# Patient Record
Sex: Female | Born: 1995 | Race: Black or African American | Hispanic: No | Marital: Single | Smoking: Never smoker
Health system: Southern US, Community
[De-identification: ages and names within clinical notes are randomized; demographics above are authoritative.]

## PROBLEM LIST (undated history)

## (undated) ENCOUNTER — Inpatient Hospital Stay: Payer: Self-pay

## (undated) DIAGNOSIS — M419 Scoliosis, unspecified: Secondary | ICD-10-CM

## (undated) DIAGNOSIS — Z915 Personal history of self-harm: Secondary | ICD-10-CM

## (undated) DIAGNOSIS — T7840XA Allergy, unspecified, initial encounter: Secondary | ICD-10-CM

## (undated) DIAGNOSIS — Z87440 Personal history of urinary (tract) infections: Secondary | ICD-10-CM

## (undated) DIAGNOSIS — F419 Anxiety disorder, unspecified: Secondary | ICD-10-CM

## (undated) DIAGNOSIS — IMO0002 Reserved for concepts with insufficient information to code with codable children: Secondary | ICD-10-CM

## (undated) HISTORY — DX: Reserved for concepts with insufficient information to code with codable children: IMO0002

## (undated) HISTORY — PX: WISDOM TOOTH EXTRACTION: SHX21

## (undated) HISTORY — PX: OTHER SURGICAL HISTORY: SHX169

## (undated) HISTORY — DX: Scoliosis, unspecified: M41.9

## (undated) HISTORY — DX: Allergy, unspecified, initial encounter: T78.40XA

## (undated) HISTORY — DX: Personal history of urinary (tract) infections: Z87.440

## (undated) HISTORY — DX: Anxiety disorder, unspecified: F41.9

---
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
## 2015-01-14 ENCOUNTER — Emergency Department: Payer: Self-pay | Admitting: Emergency Medicine

## 2015-05-30 ENCOUNTER — Encounter: Payer: Self-pay | Admitting: *Deleted

## 2015-05-30 ENCOUNTER — Emergency Department
Admission: EM | Admit: 2015-05-30 | Discharge: 2015-05-31 | Disposition: A | Discharge status: Home / Self Care | Payer: Medicaid Other | Attending: Student | Admitting: Student

## 2015-05-30 DIAGNOSIS — Y9389 Activity, other specified: Secondary | ICD-10-CM | POA: Insufficient documentation

## 2015-05-30 DIAGNOSIS — S79921A Unspecified injury of right thigh, initial encounter: Secondary | ICD-10-CM | POA: Diagnosis present

## 2015-05-30 DIAGNOSIS — Y92410 Unspecified street and highway as the place of occurrence of the external cause: Secondary | ICD-10-CM | POA: Insufficient documentation

## 2015-05-30 DIAGNOSIS — S76911A Strain of unspecified muscles, fascia and tendons at thigh level, right thigh, initial encounter: Secondary | ICD-10-CM

## 2015-05-30 DIAGNOSIS — Y998 Other external cause status: Secondary | ICD-10-CM | POA: Insufficient documentation

## 2015-05-30 DIAGNOSIS — Z720 Tobacco use: Secondary | ICD-10-CM | POA: Insufficient documentation
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
## 2015-05-31 MED ORDER — KETOROLAC TROMETHAMINE 10 MG PO TABS: 10.0000 mg | ORAL_TABLET | Freq: Three times a day (TID) | ORAL | Status: DC

## 2015-05-31 MED ORDER — KETOROLAC TROMETHAMINE 10 MG PO TABS
ORAL_TABLET | ORAL | Status: AC
  Administered 2015-05-31: 20 mg via ORAL
  Filled 2015-05-31: qty 2

## 2015-05-31 MED ORDER — KETOROLAC TROMETHAMINE 10 MG PO TABS
20.0000 mg | ORAL_TABLET | Freq: Once | ORAL | Status: AC
  Administered 2015-05-31: 20 mg via ORAL
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
## 2015-12-10 ENCOUNTER — Encounter: Payer: Self-pay | Admitting: *Deleted

## 2015-12-10 ENCOUNTER — Emergency Department
Admission: EM | Admit: 2015-12-10 | Discharge: 2015-12-10 | Disposition: A | Discharge status: Home / Self Care | Payer: Medicaid Other | Attending: Emergency Medicine | Admitting: Emergency Medicine

## 2015-12-10 DIAGNOSIS — Z791 Long term (current) use of non-steroidal anti-inflammatories (NSAID): Secondary | ICD-10-CM | POA: Diagnosis not present

## 2015-12-10 DIAGNOSIS — F17200 Nicotine dependence, unspecified, uncomplicated: Secondary | ICD-10-CM | POA: Diagnosis not present

## 2015-12-10 DIAGNOSIS — M542 Cervicalgia: Secondary | ICD-10-CM | POA: Diagnosis present

## 2015-12-10 DIAGNOSIS — M62838 Other muscle spasm: Secondary | ICD-10-CM | POA: Insufficient documentation

## 2015-12-10 MED ORDER — MELOXICAM 15 MG PO TABS: 15.0000 mg | ORAL_TABLET | Freq: Every day | ORAL | Status: DC

## 2015-12-10 MED ORDER — METHOCARBAMOL 500 MG PO TABS: 500.0000 mg | ORAL_TABLET | Freq: Four times a day (QID) | ORAL | Status: DC

## 2015-12-10 NOTE — Discharge Instructions (Signed)
Muscle Cramps and Spasms °Muscle cramps and spasms occur when a muscle or muscles tighten and you have no control over this tightening (involuntary muscle contraction). They are a common problem and can develop in any muscle. The most common place is in the calf muscles of the leg. Both muscle cramps and muscle spasms are involuntary muscle contractions, but they also have differences:  °· Muscle cramps are sporadic and painful. They may last a few seconds to a quarter of an hour. Muscle cramps are often more forceful and last longer than muscle spasms. °· Muscle spasms may or may not be painful. They may also last just a few seconds or much longer. °CAUSES  °It is uncommon for cramps or spasms to be due to a serious underlying problem. In many cases, the cause of cramps or spasms is unknown. Some common causes are:  °· Overexertion.   °· Overuse from repetitive motions (doing the same thing over and over).   °· Remaining in a certain position for a long period of time.   °· Improper preparation, form, or technique while performing a sport or activity.   °· Dehydration.   °· Injury.   °· Side effects of some medicines.   °· Abnormally low levels of the salts and ions in your blood (electrolytes), especially potassium and calcium. This could happen if you are taking water pills (diuretics) or you are pregnant.   °Some underlying medical problems can make it more likely to develop cramps or spasms. These include, but are not limited to:  °· Diabetes.   °· Parkinson disease.   °· Hormone disorders, such as thyroid problems.   °· Alcohol abuse.   °· Diseases specific to muscles, joints, and bones.   °· Blood vessel disease where not enough blood is getting to the muscles.   °HOME CARE INSTRUCTIONS  °· Stay well hydrated. Drink enough water and fluids to keep your urine clear or pale yellow. °· It may be helpful to massage, stretch, and relax the affected muscle. °· For tight or tense muscles, use a warm towel, heating  pad, or hot shower water directed to the affected area. °· If you are sore or have pain after a cramp or spasm, applying ice to the affected area may relieve discomfort. °· Put ice in a plastic bag. °· Place a towel between your skin and the bag. °· Leave the ice on for 15-20 minutes, 03-04 times a day. °· Medicines used to treat a known cause of cramps or spasms may help reduce their frequency or severity. Only take over-the-counter or prescription medicines as directed by your caregiver. °SEEK MEDICAL CARE IF:  °Your cramps or spasms get more severe, more frequent, or do not improve over time.  °MAKE SURE YOU:  °· Understand these instructions. °· Will watch your condition. °· Will get help right away if you are not doing well or get worse. °  °This information is not intended to replace advice given to you by your health care provider. Make sure you discuss any questions you have with your health care provider. °  °Document Released: 05/02/2002 Document Revised: 03/07/2013 Document Reviewed: 10/27/2012 °Elsevier Interactive Patient Education ©2016 Elsevier Inc. ° °Heat Therapy °Heat therapy can help ease sore, stiff, injured, and tight muscles and joints. Heat relaxes your muscles, which may help ease your pain.  °RISKS AND COMPLICATIONS °If you have any of the following conditions, do not use heat therapy unless your health care provider has approved: °· Poor circulation. °· Healing wounds or scarred skin in the area being treated. °·   Diabetes, heart disease, or high blood pressure. °· Not being able to feel (numbness) the area being treated. °· Unusual swelling of the area being treated. °· Active infections. °· Blood clots. °· Cancer. °· Inability to communicate pain. This may include young children and people who have problems with their brain function (dementia). °· Pregnancy. °Heat therapy should only be used on old, pre-existing, or long-lasting (chronic) injuries. Do not use heat therapy on new injuries  unless directed by your health care provider. °HOW TO USE HEAT THERAPY °There are several different kinds of heat therapy, including: °· Moist heat pack. °· Warm water bath. °· Hot water bottle. °· Electric heating pad. °· Heated gel pack. °· Heated wrap. °· Electric heating pad. °Use the heat therapy method suggested by your health care provider. Follow your health care provider's instructions on when and how to use heat therapy. °GENERAL HEAT THERAPY RECOMMENDATIONS °· Do not sleep while using heat therapy. Only use heat therapy while you are awake. °· Your skin may turn pink while using heat therapy. Do not use heat therapy if your skin turns red. °· Do not use heat therapy if you have new pain. °· High heat or long exposure to heat can cause burns. Be careful when using heat therapy to avoid burning your skin. °· Do not use heat therapy on areas of your skin that are already irritated, such as with a rash or sunburn. °SEEK MEDICAL CARE IF: °· You have blisters, redness, swelling, or numbness. °· You have new pain. °· Your pain is worse. °MAKE SURE YOU: °· Understand these instructions. °· Will watch your condition. °· Will get help right away if you are not doing well or get worse. °  °This information is not intended to replace advice given to you by your health care provider. Make sure you discuss any questions you have with your health care provider. °  °Document Released: 02/02/2012 Document Revised: 12/01/2014 Document Reviewed: 01/03/2014 °Elsevier Interactive Patient Education ©2016 Elsevier Inc. ° °

## 2015-12-10 NOTE — ED Notes (Signed)
Pt complains of waking up with a stiff neck, pt denies any other symptoms

## 2015-12-10 NOTE — ED Provider Notes (Signed)
[facility] Emergency Department Provider Note  ____________________________________________  Time seen: Approximately 6:01 PM  I have reviewed the triage vital signs and the nursing notes.   HISTORY  Chief Complaint Torticollis    HPI Morgan Young is a 20 y.o. female who presents to emergency department complaining of right-sided neck pain. Patient states that symptoms began this afternoon. No history of injury or overuse. Patient denies any radicular symptoms down her right upper extremity. Patient is not taking any medications prior to arrival. Patient denies any headache, visual acuity changes, chest pain, shortness of breath, abdominal pain, nausea or vomiting. She denies any fevers or chills.   History reviewed. No pertinent past medical history.  There are no active problems to display for this patient.   History reviewed. No pertinent past surgical history.  Current Outpatient Rx  Name  Route  Sig  Dispense  Refill  . ketorolac (TORADOL) 10 MG tablet   Oral   Take 1 tablet (10 mg total) by mouth every 8 (eight) hours.   15 tablet   0   . meloxicam (MOBIC) 15 MG tablet   Oral   Take 1 tablet (15 mg total) by mouth daily.   30 tablet   0   . methocarbamol (ROBAXIN) 500 MG tablet   Oral   Take 1 tablet (500 mg total) by mouth 4 (four) times daily.   16 tablet   0     Allergies Review of patient's allergies indicates no known allergies.  No family history on file.  Social History Social History  Substance Use Topics  . Smoking status: Current Some Day Smoker  . Smokeless tobacco: None  . Alcohol Use: No     Review of Systems  Constitutional: No fever/chills Eyes: No visual changes. No discharge ENT: No sore throat. Cardiovascular: no chest pain. Respiratory: no cough. No SOB. Gastrointestinal: No abdominal pain.  No nausea, no vomiting.  No diarrhea.  No constipation. Genitourinary: Negative for dysuria. No  hematuria Musculoskeletal: Negative for back pain. Positive for right-sided neck pain. Skin: Negative for rash. Neurological: Negative for headaches, focal weakness or numbness. 10-point ROS otherwise negative.  ____________________________________________   PHYSICAL EXAM:  VITAL SIGNS: ED Triage Vitals  Enc Vitals Group     BP 12/10/15 1709 133/66 mmHg     Pulse Rate 12/10/15 1709 80     Resp 12/10/15 1709 20     Temp 12/10/15 1709 98.5 F (36.9 C)     Temp Source 12/10/15 1709 Oral     SpO2 12/10/15 1709 96 %     Weight 12/10/15 1709 130 lb (58.968 kg)     Height 12/10/15 1709 5\' 3"  (1.6 m)     Head Cir --      Peak Flow --      Pain Score --      Pain Loc --      Pain Edu? --      Excl. in GC? --      Constitutional: Alert and oriented. Well appearing and in no acute distress. Eyes: Conjunctivae are normal. PERRL. EOMI. Head: Atraumatic. ENT:      Ears:       Nose: No congestion/rhinnorhea.      Mouth/Throat: Mucous membranes are moist.  Neck: No stridor. No visible abnormality to neck to Inspection. No cervical spine tenderness to palpation. She is tenderness to palpation over the paraspinal muscle group right side. Palpable spasms are noted to paraspinal muscle group. Hematological/Lymphatic/Immunilogical:  No cervical lymphadenopathy. Cardiovascular: Normal rate, regular rhythm. Normal S1 and S2.  Good peripheral circulation. Respiratory: Normal respiratory effort without tachypnea or retractions. Lungs CTAB. Gastrointestinal: Soft and nontender. No distention. No CVA tenderness. Musculoskeletal: No lower extremity tenderness nor edema.  No joint effusions. Neurologic:  Normal speech and language. No gross focal neurologic deficits are appreciated.  Skin:  Skin is warm, dry and intact. No rash noted. Psychiatric: Mood and affect are normal. Speech and behavior are normal. Patient exhibits appropriate insight and  judgement.   ____________________________________________   LABS (all labs ordered are listed, but only abnormal results are displayed)  Labs Reviewed - No data to display ____________________________________________  EKG   ____________________________________________  RADIOLOGY   No results found.  ____________________________________________    PROCEDURES  Procedure(s) performed:       Medications - No data to display   ____________________________________________   INITIAL IMPRESSION / ASSESSMENT AND PLAN / ED COURSE  Pertinent labs & imaging results that were available during my care of the patient were reviewed by me and considered in my medical decision making (see chart for details).  Patient's diagnosis is consistent with paraspinal muscle spasms in the right cervical region. There is no known injury prior to symptoms. Patient will not be x-rayed at this time. Patient will be discharged home with prescriptions for muscle relaxers and anti-inflammatories. Patient is to follow up with [facility] care provider if symptoms persist past this treatment course. Patient is given ED precautions to return to the ED for any worsening or new symptoms.     ____________________________________________  FINAL CLINICAL IMPRESSION(S) / ED DIAGNOSES  Final diagnoses:  Cervical paraspinal muscle spasm      NEW MEDICATIONS STARTED DURING THIS VISIT:  Discharge Medication List as of 12/10/2015  6:15 PM    START taking these medications   Details  meloxicam (MOBIC) 15 MG tablet Take 1 tablet (15 mg total) by mouth daily., Starting 12/10/2015, Until Discontinued, Print    methocarbamol (ROBAXIN) 500 MG tablet Take 1 tablet (500 mg total) by mouth 4 (four) times daily., Starting 12/10/2015, Until Discontinued, Print            Racheal Patches, PA-C 12/10/15 1851  Phineas Semen, MD 12/10/15 1911

## 2016-02-08 ENCOUNTER — Emergency Department
Admission: EM | Admit: 2016-02-08 | Discharge: 2016-02-08 | Disposition: A | Discharge status: Home / Self Care | Payer: Medicaid Other | Attending: Emergency Medicine | Admitting: Emergency Medicine

## 2016-02-08 ENCOUNTER — Encounter: Payer: Self-pay | Admitting: Emergency Medicine

## 2016-02-08 DIAGNOSIS — R109 Unspecified abdominal pain: Secondary | ICD-10-CM | POA: Diagnosis present

## 2016-02-08 DIAGNOSIS — R112 Nausea with vomiting, unspecified: Secondary | ICD-10-CM

## 2016-02-08 DIAGNOSIS — Z3202 Encounter for pregnancy test, result negative: Secondary | ICD-10-CM | POA: Diagnosis not present

## 2016-02-08 DIAGNOSIS — F17210 Nicotine dependence, cigarettes, uncomplicated: Secondary | ICD-10-CM | POA: Insufficient documentation

## 2016-02-08 DIAGNOSIS — N390 Urinary tract infection, site not specified: Secondary | ICD-10-CM | POA: Diagnosis not present

## 2016-02-08 LAB — COMPREHENSIVE METABOLIC PANEL
ALT: 17 U/L (ref 14–54)
ANION GAP: 7 (ref 5–15)
AST: 23 U/L (ref 15–41)
Albumin: 4.5 g/dL (ref 3.5–5.0)
Alkaline Phosphatase: 56 U/L (ref 38–126)
BUN: 14 mg/dL (ref 6–20)
CALCIUM: 9.5 mg/dL (ref 8.9–10.3)
CO2: 23 mmol/L (ref 22–32)
Chloride: 105 mmol/L (ref 101–111)
Creatinine, Ser: 0.75 mg/dL (ref 0.44–1.00)
GFR calc non Af Amer: >60 mL/min (ref >60)
Glucose, Bld: 98 mg/dL (ref 65–99)
POTASSIUM: 3.4 mmol/L — AB (ref 3.5–5.1)
SODIUM: 135 mmol/L (ref 135–145)
TOTAL PROTEIN: 8.1 g/dL (ref 6.5–8.1)
Total Bilirubin: 0.7 mg/dL (ref 0.3–1.2)

## 2016-02-08 LAB — URINALYSIS COMPLETE WITH MICROSCOPIC (ARMC ONLY)
Bilirubin Urine: NEGATIVE
Glucose, UA: NEGATIVE mg/dL
Nitrite: POSITIVE — AB
PH: 6 (ref 5.0–8.0)
PROTEIN: NEGATIVE mg/dL
SPECIFIC GRAVITY, URINE: 1.026 (ref 1.005–1.030)

## 2016-02-08 LAB — CBC
HEMATOCRIT: 41.2 % (ref 35.0–47.0)
HEMOGLOBIN: 14.1 g/dL (ref 12.0–16.0)
MCH: 30 pg (ref 26.0–34.0)
MCHC: 34.1 g/dL (ref 32.0–36.0)
MCV: 87.9 fL (ref 80.0–100.0)
Platelets: 222 10*3/uL (ref 150–440)
RBC: 4.69 MIL/uL (ref 3.80–5.20)
RDW: 13 % (ref 11.5–14.5)
WBC: 5 10*3/uL (ref 3.6–11.0)

## 2016-02-08 LAB — POCT PREGNANCY, URINE: PREG TEST UR: NEGATIVE

## 2016-02-08 LAB — LIPASE, BLOOD: Lipase: 17 U/L (ref 11–51)

## 2016-02-08 MED ORDER — ONDANSETRON 4 MG PO TBDP
ORAL_TABLET | ORAL | Status: AC
  Administered 2016-02-08: 4 mg via ORAL
  Filled 2016-02-08: qty 1

## 2016-02-08 MED ORDER — ONDANSETRON 4 MG PO TBDP
4.0000 mg | ORAL_TABLET | Freq: Once | ORAL | Status: AC
  Administered 2016-02-08: 4 mg via ORAL

## 2016-02-08 MED ORDER — MORPHINE SULFATE (PF) 2 MG/ML IV SOLN
INTRAVENOUS | Status: AC
  Administered 2016-02-08: 2 mg via INTRAVENOUS
  Filled 2016-02-08: qty 1

## 2016-02-08 MED ORDER — SODIUM CHLORIDE 0.9 % IV BOLUS (SEPSIS)
1000.0000 mL | Freq: Once | INTRAVENOUS | Status: AC
  Administered 2016-02-08: 1000 mL via INTRAVENOUS

## 2016-02-08 MED ORDER — ONDANSETRON HCL 4 MG/2ML IJ SOLN
4.0000 mg | Freq: Once | INTRAMUSCULAR | Status: AC
  Administered 2016-02-08: 4 mg via INTRAVENOUS

## 2016-02-08 MED ORDER — ONDANSETRON HCL 4 MG/2ML IJ SOLN
INTRAMUSCULAR | Status: AC
  Administered 2016-02-08: 4 mg via INTRAVENOUS
  Filled 2016-02-08: qty 2

## 2016-02-08 MED ORDER — SULFAMETHOXAZOLE-TRIMETHOPRIM 800-160 MG PO TABS: 1.0000 | ORAL_TABLET | Freq: Two times a day (BID) | ORAL | Status: DC

## 2016-02-08 MED ORDER — ONDANSETRON 4 MG PO TBDP: 4.0000 mg | ORAL_TABLET | Freq: Three times a day (TID) | ORAL | Status: DC | PRN

## 2016-02-08 MED ORDER — MORPHINE SULFATE (PF) 2 MG/ML IV SOLN
2.0000 mg | Freq: Once | INTRAVENOUS | Status: AC
  Administered 2016-02-08: 2 mg via INTRAVENOUS

## 2016-02-08 NOTE — ED Notes (Signed)
Pt vomited into emesis bag. Emesis bag changed out. MD Malinda notified.

## 2016-02-08 NOTE — ED Notes (Signed)
Patient discharged.  Complaining of lightheadedness when she stood up.  Patient sat back down on end of stretcher.  IV removed left forearm without redness or edema.  Catheter intact.  Patient given ice water to drink.  States she feels better.  Advised not to drive or operate heavy machinery today.

## 2016-02-08 NOTE — Discharge Instructions (Signed)
Abdominal Pain, Adult Many things can cause abdominal pain. Usually, abdominal pain is not caused by a disease and will improve without treatment. It can often be observed and treated at home. Your health care provider will do a physical exam and possibly order blood tests and X-rays to help determine the seriousness of your pain. However, in many cases, more time must pass before a clear cause of the pain can be found. Before that point, your health care provider may not know if you need more testing or further treatment. HOME CARE INSTRUCTIONS Monitor your abdominal pain for any changes. The following actions may help to alleviate any discomfort you are experiencing:  Only take over-the-counter or prescription medicines as directed by your health care provider.  Do not take laxatives unless directed to do so by your health care provider.  Try a clear liquid diet (broth, tea, or water) as directed by your health care provider. Slowly move to a bland diet as tolerated. SEEK MEDICAL CARE IF:  You have unexplained abdominal pain.  You have abdominal pain associated with nausea or diarrhea.  You have pain when you urinate or have a bowel movement.  You experience abdominal pain that wakes you in the night.  You have abdominal pain that is worsened or improved by eating food.  You have abdominal pain that is worsened with eating fatty foods.  You have a fever. SEEK IMMEDIATE MEDICAL CARE IF:  Your pain does not go away within 2 hours.  You keep throwing up (vomiting).  Your pain is felt only in portions of the abdomen, such as the right side or the left lower portion of the abdomen.  You pass bloody or black tarry stools. MAKE SURE YOU:  Understand these instructions.  Will watch your condition.  Will get help right away if you are not doing well or get worse.   This information is not intended to replace advice given to you by your health care provider. Make sure you discuss  any questions you have with your health care provider.   Document Released: 08/20/2005 Document Revised: 08/01/2015 Document Reviewed: 07/20/2013 Elsevier Interactive Patient Education 2016 [employer] Be careful taking the Bactrim sometimes it may interfere with the Implanon. He might want to use a secondary method of birth control like. Condoms while you're taking it for a short time afterwards.

## 2016-02-08 NOTE — ED Notes (Signed)
Discharged via wheelchair to car

## 2016-02-08 NOTE — ED Notes (Signed)
Patient ambulatory to triage with steady gait, without difficulty or distress noted; pt reports nausea x 2 wks; V x 1 with mid abd pain

## 2016-02-08 NOTE — ED Provider Notes (Signed)
[facility] Emergency Department Provider Note  ____________________________________________  Time seen: Approximately 5:41 AM  I have reviewed the triage vital signs and the nursing notes.   HISTORY  Chief Complaint Abdominal Pain    HPI Morgan Young is a 20 y.o. female patient reports she had Implanon placed in January. This replaced on old Implanon she had for 3 years. She shows me the car but the cardiac she said she had it done in March 2016. She is fairly certain she had done this January at any rate patient has complained of nausea for about 2 weeks and then today began having vomiting she is also having cramping as if she is got have diarrhea. Patient reports that she's had the Implanon placed she's been having back aches and hot flashes and other symptoms which she did not have previously. She is wondering if she could've gotten pregnant on Implanon.   History reviewed. No pertinent past medical history.  There are no active problems to display for this patient.   History reviewed. No pertinent past surgical history.  Current Outpatient Rx  Name  Route  Sig  Dispense  Refill  . ketorolac (TORADOL) 10 MG tablet   Oral   Take 1 tablet (10 mg total) by mouth every 8 (eight) hours. Patient not taking: Reported on 02/08/2016   15 tablet   0   . meloxicam (MOBIC) 15 MG tablet   Oral   Take 1 tablet (15 mg total) by mouth daily. Patient not taking: Reported on 02/08/2016   30 tablet   0   . methocarbamol (ROBAXIN) 500 MG tablet   Oral   Take 1 tablet (500 mg total) by mouth 4 (four) times daily. Patient not taking: Reported on 02/08/2016   16 tablet   0     Allergies Review of patient's allergies indicates no known allergies.  No family history on file.  Social History Social History  Substance Use Topics  . Smoking status: Current Some Day Smoker  . Smokeless tobacco: None  . Alcohol Use: No    Review of Systems Constitutional:  No fever/chills Eyes: No visual changes. ENT: No sore throat. Cardiovascular: Denies chest pain. Respiratory: Denies shortness of breath. Gastrointestinal: See history of present illness Genitourinary: Negative for dysuria. Musculoskeletal: Negative for back pain. Skin: Negative for rash. Neurological: Negative for headaches, focal weakness or numbness.  10-point ROS otherwise negative.  ____________________________________________   PHYSICAL EXAM:  VITAL SIGNS: ED Triage Vitals  Enc Vitals Group     BP 02/08/16 0252 119/61 mmHg     Pulse Rate 02/08/16 0252 94     Resp 02/08/16 0252 20     Temp 02/08/16 0252 98 F (36.7 C)     Temp Source 02/08/16 0252 Oral     SpO2 02/08/16 0252 99 %     Weight 02/08/16 0252 165 lb (74.844 kg)     Height 02/08/16 0252  (1.702 m)     Head Cir --      Peak Flow --      Pain Score 02/08/16 0252 9     Pain Loc --      Pain Edu? --      Excl. in GC? --     Constitutional: Alert and oriented. Well appearing and in no acute distress. Eyes: Conjunctivae are normal. PERRL. EOMI. Head: Atraumatic. Nose: No congestion/rhinnorhea. Mouth/Throat: Mucous membranes are moist.  Oropharynx non-erythematous. Neck: No stridor.   Cardiovascular: Normal rate, regular  rhythm. Grossly normal heart sounds.  Good peripheral circulation. Respiratory: Normal respiratory effort.  No retractions. Lungs CTAB. Gastrointestinal: Soft Hyperactive bowel sounds there is tenderness to palpation throughout especially in the middle of the abdomen.. No distention. No abdominal bruits. No CVA tenderness. Musculoskeletal: No lower extremity tenderness nor edema.  No joint effusions. Neurologic:  Normal speech and language. No gross focal neurologic deficits are appreciated. No gait instability. Skin:  Skin is warm, dry and intact. No rash noted. Psychiatric: Mood and affect are normal. Speech and behavior are normal.  ____________________________________________    LABS (all labs ordered are listed, but only abnormal results are displayed)  Labs Reviewed  COMPREHENSIVE METABOLIC PANEL - Abnormal; Notable for the following:    Potassium 3.4 (*)    All other components within normal limits  LIPASE, BLOOD  CBC  URINALYSIS COMPLETEWITH MICROSCOPIC ([facility] ONLY)  POC URINE PREG, ED   ____________________________________________  EKG   ____________________________________________  RADIOLOGY   ____________________________________________   PROCEDURES    ____________________________________________   INITIAL IMPRESSION / ASSESSMENT AND PLAN / ED COURSE  Pertinent labs & imaging results that were available during my care of the patient were reviewed by me and considered in my medical decision making (see chart for details).  Patient's urine is pending patient has not urinated yet will sign the patient out to Dr. Dione BoozeKanner. ____________________________________________   FINAL CLINICAL IMPRESSION(S) / ED DIAGNOSES  Final diagnoses:  Nausea and vomiting, vomiting of unspecified type  Abdominal pain, unspecified abdominal location      Arnaldo NatalPaul F Shamikia Linskey, MD 02/08/16 [phone]

## 2016-02-08 NOTE — ED Notes (Signed)
Pt states she is still unable to void at this time

## 2016-06-05 ENCOUNTER — Encounter: Payer: Self-pay | Admitting: *Deleted

## 2016-06-05 ENCOUNTER — Emergency Department
Admission: EM | Admit: 2016-06-05 | Discharge: 2016-06-05 | Disposition: A | Discharge status: Home / Self Care | Payer: Medicaid Other | Attending: Emergency Medicine | Admitting: Emergency Medicine

## 2016-06-05 DIAGNOSIS — R197 Diarrhea, unspecified: Secondary | ICD-10-CM | POA: Diagnosis not present

## 2016-06-05 DIAGNOSIS — R51 Headache: Secondary | ICD-10-CM | POA: Insufficient documentation

## 2016-06-05 DIAGNOSIS — F17200 Nicotine dependence, unspecified, uncomplicated: Secondary | ICD-10-CM | POA: Diagnosis not present

## 2016-06-05 DIAGNOSIS — Z79899 Other long term (current) drug therapy: Secondary | ICD-10-CM | POA: Diagnosis not present

## 2016-06-05 DIAGNOSIS — R112 Nausea with vomiting, unspecified: Secondary | ICD-10-CM | POA: Diagnosis not present

## 2016-06-05 DIAGNOSIS — R519 Headache, unspecified: Secondary | ICD-10-CM

## 2016-06-05 DIAGNOSIS — R109 Unspecified abdominal pain: Secondary | ICD-10-CM

## 2016-06-05 LAB — COMPREHENSIVE METABOLIC PANEL
ALK PHOS: 48 U/L (ref 38–126)
ALT: 12 U/L — AB (ref 14–54)
AST: 18 U/L (ref 15–41)
Albumin: 4.1 g/dL (ref 3.5–5.0)
Anion gap: 6 (ref 5–15)
BILIRUBIN TOTAL: 1.5 mg/dL — AB (ref 0.3–1.2)
BUN: 13 mg/dL (ref 6–20)
CALCIUM: 9 mg/dL (ref 8.9–10.3)
CHLORIDE: 107 mmol/L (ref 101–111)
CO2: 23 mmol/L (ref 22–32)
CREATININE: 0.7 mg/dL (ref 0.44–1.00)
Glucose, Bld: 95 mg/dL (ref 65–99)
Potassium: 3.6 mmol/L (ref 3.5–5.1)
Sodium: 136 mmol/L (ref 135–145)
TOTAL PROTEIN: 7.3 g/dL (ref 6.5–8.1)

## 2016-06-05 LAB — URINALYSIS COMPLETE WITH MICROSCOPIC (ARMC ONLY)
BILIRUBIN URINE: NEGATIVE
Bacteria, UA: NONE SEEN
GLUCOSE, UA: NEGATIVE mg/dL
KETONES UR: NEGATIVE mg/dL
LEUKOCYTES UA: NEGATIVE
Nitrite: NEGATIVE
Protein, ur: NEGATIVE mg/dL
Specific Gravity, Urine: 1.011 (ref 1.005–1.030)
pH: 6 (ref 5.0–8.0)

## 2016-06-05 LAB — CBC
HCT: 37.6 % (ref 35.0–47.0)
Hemoglobin: 13.3 g/dL (ref 12.0–16.0)
MCH: 31.1 pg (ref 26.0–34.0)
MCHC: 35.5 g/dL (ref 32.0–36.0)
MCV: 87.8 fL (ref 80.0–100.0)
PLATELETS: 211 10*3/uL (ref 150–440)
RBC: 4.28 MIL/uL (ref 3.80–5.20)
RDW: 13.3 % (ref 11.5–14.5)
WBC: 4.1 10*3/uL (ref 3.6–11.0)

## 2016-06-05 LAB — POCT PREGNANCY, URINE: Preg Test, Ur: NEGATIVE

## 2016-06-05 LAB — LIPASE, BLOOD: Lipase: 20 U/L (ref 11–51)

## 2016-06-05 MED ORDER — KETOROLAC TROMETHAMINE 30 MG/ML IJ SOLN
30.0000 mg | Freq: Once | INTRAMUSCULAR | Status: AC
Start: 2016-06-05 — End: 2016-06-05
  Administered 2016-06-05: 30 mg via INTRAVENOUS
  Filled 2016-06-05: qty 1

## 2016-06-05 MED ORDER — ONDANSETRON HCL 4 MG/2ML IJ SOLN
4.0000 mg | Freq: Once | INTRAMUSCULAR | Status: AC
Start: 2016-06-05 — End: 2016-06-05
  Administered 2016-06-05: 4 mg via INTRAVENOUS
  Filled 2016-06-05: qty 2

## 2016-06-05 MED ORDER — ACETAMINOPHEN 500 MG PO TABS
1000.0000 mg | ORAL_TABLET | Freq: Once | ORAL | Status: AC
  Administered 2016-06-05: 1000 mg via ORAL
  Filled 2016-06-05: qty 2

## 2016-06-05 MED ORDER — METOCLOPRAMIDE HCL 10 MG PO TABS: 10.0000 mg | ORAL_TABLET | Freq: Three times a day (TID) | ORAL | Status: DC | PRN

## 2016-06-05 MED ORDER — PROCHLORPERAZINE EDISYLATE 5 MG/ML IJ SOLN
10.0000 mg | Freq: Once | INTRAMUSCULAR | Status: DC
  Filled 2016-06-05: qty 2

## 2016-06-05 MED ORDER — SODIUM CHLORIDE 0.9 % IV BOLUS (SEPSIS)
1000.0000 mL | Freq: Once | INTRAVENOUS | Status: AC
  Administered 2016-06-05: 1000 mL via INTRAVENOUS

## 2016-06-05 NOTE — ED Provider Notes (Signed)
[facility] Emergency Department Provider Note    ____________________________________________  Time seen: ~0625  I have reviewed the triage vital signs and the nursing notes.   HISTORY  Chief Complaint Headache and Abdominal Pain   History limited by: Not Limited   HPI AVERIE Young is a 20 y.o. female who presents to the emergency department today with complaints of headache and abdominal pain. The patient says that the symptoms started yesterday after getting off work. They have been persistent since then. She describes the headache as being throbbing in nature. It is located throughout her head. It is severe. She has taken over-the-counter medications which will temporarily helped the headache. She says she does have a history of headaches however this one is more severe than most. In addition the patient is complaining of abdominal pain. She does have a history of frequent abdominal pains and has seen primary care for this in the past. She states that they've given her medication although have not been able to tell her what the abdominal pain is due to. She has had associated nausea and vomiting. Additionally she has had diarrhea. She denies any blood in the vomit or diarrhea. She denies any fevers.   History reviewed. No pertinent past medical history.  There are no active problems to display for this patient.   History reviewed. No pertinent past surgical history.  Current Outpatient Rx  Name  Route  Sig  Dispense  Refill  . ketorolac (TORADOL) 10 MG tablet   Oral   Take 1 tablet (10 mg total) by mouth every 8 (eight) hours. Patient not taking: Reported on 02/08/2016   15 tablet   0   . meloxicam (MOBIC) 15 MG tablet   Oral   Take 1 tablet (15 mg total) by mouth daily. Patient not taking: Reported on 02/08/2016   30 tablet   0   . methocarbamol (ROBAXIN) 500 MG tablet   Oral   Take 1 tablet (500 mg total) by mouth 4 (four) times  daily. Patient not taking: Reported on 02/08/2016   16 tablet   0   . ondansetron (ZOFRAN ODT) 4 MG disintegrating tablet   Oral   Take 1 tablet (4 mg total) by mouth every 8 (eight) hours as needed for nausea or vomiting.   20 tablet   0   . sulfamethoxazole-trimethoprim (BACTRIM DS,SEPTRA DS) 800-160 MG tablet   Oral   Take 1 tablet by mouth 2 (two) times daily.   20 tablet   0     Allergies Review of patient's allergies indicates no known allergies.  History reviewed. No pertinent family history.  Social History Social History  Substance Use Topics  . Smoking status: Current Some Day Smoker  . Smokeless tobacco: None  . Alcohol Use: No    Review of Systems  Constitutional: Negative for fever. Cardiovascular: Negative for chest pain. Respiratory: Negative for shortness of breath. Gastrointestinal: Positive for abdominal pain, vomiting and diarrhea. Genitourinary: Negative for dysuria. Musculoskeletal: Negative for back pain. Skin: Negative for rash. Neurological: Positive for headache  10-point ROS otherwise negative.  ____________________________________________   PHYSICAL EXAM:  VITAL SIGNS: ED Triage Vitals  Enc Vitals Group     BP 06/05/16 0612 110/65 mmHg     Pulse Rate 06/05/16 0612 75     Resp 06/05/16 0612 18     Temp 06/05/16 0612 98.4 F (36.9 C)     Temp Source 06/05/16 0612 Oral     SpO2  06/05/16 0612 99 %     Weight 06/05/16 0612 150 lb (68.04 kg)     Height 06/05/16 0612 5\' 7"  (1.702 m)     Head Cir --      Peak Flow --      Pain Score 06/05/16 0610 10   Constitutional: Alert and oriented. Well appearing and in no distress. Eyes: Conjunctivae are normal. PERRL. Normal extraocular movements. ENT   Head: Normocephalic and atraumatic.   Nose: No congestion/rhinnorhea.   Mouth/Throat: Mucous membranes are moist.   Neck: No stridor. Hematological/Lymphatic/Immunilogical: No cervical lymphadenopathy. Cardiovascular:  Normal rate, regular rhythm.  No murmurs, rubs, or gallops. Respiratory: Normal respiratory effort without tachypnea nor retractions. Breath sounds are clear and equal bilaterally. No wheezes/rales/rhonchi. Gastrointestinal: Soft and nontender. No distention. There is no CVA tenderness. Genitourinary: Deferred Musculoskeletal: Normal range of motion in all extremities. No joint effusions.  No lower extremity tenderness nor edema. Neurologic:  Normal speech and language. No gross focal neurologic deficits are appreciated.  Skin:  Skin is warm, dry and intact. No rash noted. Psychiatric: Mood and affect are normal. Speech and behavior are normal. Patient exhibits appropriate insight and judgment.  ____________________________________________    LABS (pertinent positives/negatives)  Labs Reviewed  COMPREHENSIVE METABOLIC PANEL - Abnormal; Notable for the following:    ALT 12 (*)    Total Bilirubin 1.5 (*)    All other components within normal limits  LIPASE, BLOOD  CBC  URINALYSIS COMPLETEWITH MICROSCOPIC ([facility] ONLY)  POC URINE PREG, ED   Urine studies pending at this time  ____________________________________________   EKG  None  ____________________________________________    RADIOLOGY  None  ____________________________________________   PROCEDURES  Procedure(s) performed: None  Critical Care performed: No  ____________________________________________   INITIAL IMPRESSION / ASSESSMENT AND PLAN / ED COURSE  Pertinent labs & imaging results that were available during my care of the patient were reviewed by me and considered in my medical decision making (see chart for details).  Patient presented to the emergency department today with concerns for headache and abdominal pain. Patient does have a history of headaches. No focal neuro deficits on exam. Do not feel neuro imaging is warranted at this time. Will give medication. In addition patient's abdomen is  benign. Will send off blood work to evaluate for leukocytosis, elevated lipase or any other concerning findings.  ____________________________________________   FINAL CLINICAL IMPRESSION(S) / ED DIAGNOSES  Final diagnoses:  Headache, unspecified headache type  Abdominal pain, unspecified abdominal location     Note: This dictation was prepared with Dragon dictation. Any transcriptional errors that result from this process are unintentional    Phineas SemenGraydon Chane Cowden, MD 06/05/16 [phone]

## 2016-06-05 NOTE — ED Notes (Signed)
Pt cannot take compazine because she is driving herself.  MD notified and new orders placed.

## 2016-06-05 NOTE — Discharge Instructions (Signed)
Abdominal Pain, Adult °Many things can cause abdominal pain. Usually, abdominal pain is not caused by a disease and will improve without treatment. It can often be observed and treated at home. Your health care provider will do a physical exam and possibly order blood tests and X-rays to help determine the seriousness of your pain. However, in many cases, more time must pass before a clear cause of the pain can be found. Before that point, your health care provider may not know if you need more testing or further treatment. °HOME CARE INSTRUCTIONS °Monitor your abdominal pain for any changes. The following actions may help to alleviate any discomfort you are experiencing: °· Only take over-the-counter or prescription medicines as directed by your health care provider. °· Do not take laxatives unless directed to do so by your health care provider. °· Try a clear liquid diet (broth, tea, or water) as directed by your health care provider. Slowly move to a bland diet as tolerated. °SEEK MEDICAL CARE IF: °· You have unexplained abdominal pain. °· You have abdominal pain associated with nausea or diarrhea. °· You have pain when you urinate or have a bowel movement. °· You experience abdominal pain that wakes you in the night. °· You have abdominal pain that is worsened or improved by eating food. °· You have abdominal pain that is worsened with eating fatty foods. °· You have a fever. °SEEK IMMEDIATE MEDICAL CARE IF: °· Your pain does not go away within 2 hours. °· You keep throwing up (vomiting). °· Your pain is felt only in portions of the abdomen, such as the right side or the left lower portion of the abdomen. °· You pass bloody or black tarry stools. °MAKE SURE YOU: °· Understand these instructions. °· Will watch your condition. °· Will get help right away if you are not doing well or get worse. °  °This information is not intended to replace advice given to you by your health care provider. Make sure you discuss  any questions you have with your health care provider. °  °Document Released: 08/20/2005 Document Revised: 08/01/2015 Document Reviewed: 07/20/2013 °Elsevier Interactive Patient Education ©2016 [employer] ° °General Headache Without Cause °A headache is pain or discomfort felt around the head or neck area. The specific cause of a headache may not be found. There are many causes and types of headaches. A few common ones are: °· Tension headaches. °· Migraine headaches. °· Cluster headaches. °· Chronic daily headaches. °HOME CARE INSTRUCTIONS  °Watch your condition for any changes. Take these steps to help with your condition: °Managing Pain °· Take over-the-counter and prescription medicines only as told by your health care provider. °· Lie down in a dark, quiet room when you have a headache. °· If directed, apply ice to the head and neck area: °¨ Put ice in a plastic bag. °¨ Place a towel between your skin and the bag. °¨ Leave the ice on for 20 minutes, 2-3 times per day. °· Use a heating pad or hot shower to apply heat to the head and neck area as told by your health care provider. °· Keep lights dim if bright lights bother you or make your headaches worse. °Eating and Drinking °· Eat meals on a regular schedule. °· Limit alcohol use. °· Decrease the amount of caffeine you drink, or stop drinking caffeine. °General Instructions °· Keep all follow-up visits as told by your health care provider. This is important. °· Keep a headache journal to help   find out what may trigger your headaches. For example, write down: °¨ What you eat and drink. °¨ How much sleep you get. °¨ Any change to your diet or medicines. °· Try massage or other relaxation techniques. °· Limit stress. °· Sit up straight, and do not tense your muscles. °· Do not use tobacco products, including cigarettes, chewing tobacco, or e-cigarettes. If you need help quitting, ask your health care provider. °· Exercise regularly as told by your health care  provider. °· Sleep on a regular schedule. Get 7-9 hours of sleep, or the amount recommended by your health care provider. °SEEK MEDICAL CARE IF:  °· Your symptoms are not helped by medicine. °· You have a headache that is different from the usual headache. °· You have nausea or you vomit. °· You have a fever. °SEEK IMMEDIATE MEDICAL CARE IF:  °· Your headache becomes severe. °· You have repeated vomiting. °· You have a stiff neck. °· You have a loss of vision. °· You have problems with speech. °· You have pain in the eye or ear. °· You have muscular weakness or loss of muscle control. °· You lose your balance or have trouble walking. °· You feel faint or pass out. °· You have confusion. °  °This information is not intended to replace advice given to you by your health care provider. Make sure you discuss any questions you have with your health care provider. °  °Document Released: 11/10/2005 Document Revised: 08/01/2015 Document Reviewed: 03/05/2015 °Elsevier Interactive Patient Education ©2016 [employer] ° °

## 2016-06-05 NOTE — ED Notes (Signed)
Pt states pain is returning. Dr. Mayford KnifeWilliams notified.

## 2016-06-05 NOTE — ED Notes (Addendum)
Pt to ED with HA and abd pain that started yesterday after getting off work. Pt states presence of n/v/d as well. Pt states pain 10/10, generalized abd region. Pt ambulatory to triage with steady gait NAD. Vitals stable.

## 2016-06-05 NOTE — ED Provider Notes (Signed)
Patient is in no acute distress, she was given Tylenol for headache. Labs are unremarkable, she'll be discharged with antiemetics and follow-up with her doctor.  Morgan Marlette E WEmily Filbertilliams, MD 06/05/16 [phone]

## 2016-08-30 ENCOUNTER — Encounter: Payer: Self-pay | Admitting: Emergency Medicine

## 2016-08-30 ENCOUNTER — Emergency Department
Admission: EM | Admit: 2016-08-30 | Discharge: 2016-08-30 | Disposition: A | Discharge status: Home / Self Care | Payer: Medicaid Other | Attending: Emergency Medicine | Admitting: Emergency Medicine

## 2016-08-30 DIAGNOSIS — F17200 Nicotine dependence, unspecified, uncomplicated: Secondary | ICD-10-CM | POA: Insufficient documentation

## 2016-08-30 DIAGNOSIS — J029 Acute pharyngitis, unspecified: Secondary | ICD-10-CM | POA: Insufficient documentation

## 2016-08-30 DIAGNOSIS — Z5321 Procedure and treatment not carried out due to patient leaving prior to being seen by health care provider: Secondary | ICD-10-CM | POA: Diagnosis not present

## 2016-08-30 NOTE — ED Triage Notes (Addendum)
Pt c/o sore throat x 2 weeks; says she looked at her uvula today and it's "red and there's something on it"; denies fever; pain with swallowing but able to eat and drink normally; part of uvula noted to be red but minimal swelling; airway patent

## 2016-08-30 NOTE — ED Notes (Signed)
Called to treatment room in flex wait; no answer

## 2016-09-01 LAB — POCT RAPID STREP A: STREPTOCOCCUS, GROUP A SCREEN (DIRECT): NEGATIVE

## 2016-12-08 ENCOUNTER — Encounter: Payer: Self-pay | Admitting: *Deleted

## 2016-12-08 ENCOUNTER — Emergency Department
Admission: EM | Admit: 2016-12-08 | Discharge: 2016-12-08 | Disposition: A | Discharge status: Home / Self Care | Payer: Medicaid Other | Attending: Emergency Medicine | Admitting: Emergency Medicine

## 2016-12-08 DIAGNOSIS — Z79899 Other long term (current) drug therapy: Secondary | ICD-10-CM | POA: Diagnosis not present

## 2016-12-08 DIAGNOSIS — M549 Dorsalgia, unspecified: Secondary | ICD-10-CM | POA: Insufficient documentation

## 2016-12-08 DIAGNOSIS — R51 Headache: Secondary | ICD-10-CM | POA: Insufficient documentation

## 2016-12-08 DIAGNOSIS — R55 Syncope and collapse: Secondary | ICD-10-CM | POA: Insufficient documentation

## 2016-12-08 DIAGNOSIS — M62838 Other muscle spasm: Secondary | ICD-10-CM | POA: Insufficient documentation

## 2016-12-08 DIAGNOSIS — F17200 Nicotine dependence, unspecified, uncomplicated: Secondary | ICD-10-CM | POA: Diagnosis not present

## 2016-12-08 DIAGNOSIS — R1110 Vomiting, unspecified: Secondary | ICD-10-CM | POA: Insufficient documentation

## 2016-12-08 DIAGNOSIS — J029 Acute pharyngitis, unspecified: Secondary | ICD-10-CM | POA: Diagnosis not present

## 2016-12-08 DIAGNOSIS — M7918 Myalgia, other site: Secondary | ICD-10-CM

## 2016-12-08 LAB — URINALYSIS, COMPLETE (UACMP) WITH MICROSCOPIC
BACTERIA UA: NONE SEEN
BILIRUBIN URINE: NEGATIVE
Glucose, UA: NEGATIVE mg/dL
Hgb urine dipstick: NEGATIVE
KETONES UR: NEGATIVE mg/dL
Nitrite: NEGATIVE
Protein, ur: NEGATIVE mg/dL
Specific Gravity, Urine: 1.015 (ref 1.005–1.030)
pH: 6 (ref 5.0–8.0)

## 2016-12-08 LAB — DIFFERENTIAL
BASOS PCT: 0 %
Basophils Absolute: 0 10*3/uL (ref 0–0.1)
EOS ABS: 0.2 10*3/uL (ref 0–0.7)
EOS PCT: 2 %
Lymphocytes Relative: 7 %
Lymphs Abs: 0.7 10*3/uL — ABNORMAL LOW (ref 1.0–3.6)
Monocytes Absolute: 0.5 10*3/uL (ref 0.2–0.9)
Monocytes Relative: 6 %
Neutro Abs: 8.4 10*3/uL — ABNORMAL HIGH (ref 1.4–6.5)
Neutrophils Relative %: 85 %

## 2016-12-08 LAB — BASIC METABOLIC PANEL
Anion gap: 8 (ref 5–15)
BUN: 11 mg/dL (ref 6–20)
CHLORIDE: 104 mmol/L (ref 101–111)
CO2: 24 mmol/L (ref 22–32)
Calcium: 9.6 mg/dL (ref 8.9–10.3)
Creatinine, Ser: 0.61 mg/dL (ref 0.44–1.00)
GFR calc Af Amer: >60 mL/min (ref >60)
GFR calc non Af Amer: >60 mL/min (ref >60)
Glucose, Bld: 98 mg/dL (ref 65–99)
POTASSIUM: 4.1 mmol/L (ref 3.5–5.1)
SODIUM: 136 mmol/L (ref 135–145)

## 2016-12-08 LAB — CBC
HEMATOCRIT: 39.9 % (ref 35.0–47.0)
Hemoglobin: 13.9 g/dL (ref 12.0–16.0)
MCH: 31.1 pg (ref 26.0–34.0)
MCHC: 34.8 g/dL (ref 32.0–36.0)
MCV: 89.4 fL (ref 80.0–100.0)
Platelets: 233 10*3/uL (ref 150–440)
RBC: 4.46 MIL/uL (ref 3.80–5.20)
RDW: 12.8 % (ref 11.5–14.5)
WBC: 10.4 10*3/uL (ref 3.6–11.0)

## 2016-12-08 LAB — INFLUENZA PANEL BY PCR (TYPE A & B)
INFLAPCR: NEGATIVE
INFLBPCR: NEGATIVE

## 2016-12-08 LAB — POCT PREGNANCY, URINE: PREG TEST UR: NEGATIVE

## 2016-12-08 MED ORDER — CYCLOBENZAPRINE HCL 10 MG PO TABS: 10.0000 mg | ORAL_TABLET | Freq: Three times a day (TID) | ORAL | 0 refills | Status: DC | PRN

## 2016-12-08 MED ORDER — IBUPROFEN 600 MG PO TABS: 600.0000 mg | ORAL_TABLET | Freq: Three times a day (TID) | ORAL | 0 refills | Status: DC | PRN

## 2016-12-08 NOTE — Discharge Instructions (Signed)
As we discussed your evaluation and reassuring in the emergency department today.  We discussed that I think most likely of a viral syndrome giving you body aches. He also has muscle spasms and I am recommending heat, massage, muscle relaxer, and anti-inflammatory medication.  We also discussed that right now I don't have a suspicion for something more serious such as meningitis, or spinal infection, but certainly if you develop any worsening symptoms including fever, worsening headache, vision changes, weakness, numbness, confusion or altered mental status, additional passing out, or any other symptoms concerning to you, return to the emergency department immediately.

## 2016-12-08 NOTE — ED Triage Notes (Signed)
States back pain and neck pain for 2 days, states an episode of vomiting this AM, states at 2 am she began feeling weak and then states she had a syncopal episode this AM, states sore throat as well, at present awake and alert, states generalized body aches

## 2016-12-08 NOTE — ED Provider Notes (Signed)
[facility] Emergency Department Provider Note ____________________________________________   I have reviewed the triage vital signs and the triage nursing note.  HISTORY  Chief Complaint Loss of Consciousness   Historian Patient  HPI Morgan Young is a 21 y.o. female presenting today due to passing out this morning. 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 However she hasn't been working a few days. She states that her boyfriend did say that her muscles were tense in her neck. She is also been having global headaches, multiple times a day. She reports a history of migraines although states that she has not seen a neurologist or had head imaging. No vision changes. Today she felt lightheaded right before she passed out. No prolonged LOC, seizure activity or postictal state.  No chest pain or trouble breathing. No sinus congestion. No fevers. No abdominal pain. One episode of vomiting and no diarrhea.    History reviewed. No pertinent past medical history.  There are no active problems to display for this patient.   History reviewed. No pertinent surgical history.  Prior to Admission medications   Medication Sig Start Date End Date Taking? Authorizing Provider  diphenhydrAMINE (BENADRYL) 25 MG tablet Take 25 mg by mouth every 6 (six) hours as needed.   Yes Historical Provider, MD  cyclobenzaprine (FLEXERIL) 10 MG tablet Take 1 tablet (10 mg total) by mouth 3 (three) times daily as needed for muscle spasms. 12/08/16   Governor Rooksebecca Jakeb Lamping, MD  ibuprofen (ADVIL,MOTRIN) 600 MG tablet Take 1 tablet (600 mg total) by mouth every 8 (eight) hours as needed. 12/08/16   Governor Rooksebecca Freeman Borba, MD    No Known Allergies  History reviewed. No pertinent family history.  Social History Social History  Substance Use Topics  . Smoking status: Current Some Day Smoker  . Smokeless tobacco: Never Used   . Alcohol use No    Review of Systems  Constitutional: Negative for fever. Eyes: Negative for visual changes. ENT: Positive for mild sore throat. Cardiovascular: Negative for chest pain. Respiratory: Negative for shortness of breath. Gastrointestinal: Negative for abdominal pain, vomiting and diarrhea. Genitourinary: Negative for dysuria. Musculoskeletal: Positive for back pain. Skin: Negative for rash. Neurological: Positive for mild intermittent global headaches. 10 point Review of Systems otherwise negative ____________________________________________   PHYSICAL EXAM:  VITAL SIGNS: ED Triage Vitals   Enc Vitals Group     BP 110/62     Pulse Rate 100     Resp 18     Temp 98.6 F (37 C)     Temp Source Oral     SpO2 99 %     Weight 150 lb (68 kg)     Height 5\' 7"  (1.702 m)     Head Circumference      Peak Flow      Pain Score 9     Pain Loc      Pain Edu?      Excl. in GC?      Constitutional: Alert and oriented. Well appearing and in no distress. HEENT   Head: Normocephalic and atraumatic.      Eyes: Conjunctivae are normal. PERRL. Normal extraocular movements.      Ears:         Nose: No congestion/rhinnorhea.   Mouth/Throat: Mucous membranes are moist.   Neck: No stridor. Cardiovascular/Chest: Normal rate, regular rhythm.  No murmurs, rubs, or gallops. Respiratory: Normal  respiratory effort without tachypnea nor retractions. Breath sounds are clear and equal bilaterally. No wheezes/rales/rhonchi. Gastrointestinal: Soft. No distention, no guarding, no rebound. Nontender.    Genitourinary/rectal:Deferred Musculoskeletal: Tense and tender muscle spasms of the trapezius both sides and her spinous musculature in the middle back. No point tender to the spine. Nontender with normal range of motion in all extremities. No joint effusions.  No lower extremity tenderness.  No edema. Neurologic:  Normal speech and language. No gross or focal neurologic  deficits are appreciated. Skin:  Skin is warm, dry and intact. No rash noted. Psychiatric: Mood and affect are normal. Speech and behavior are normal. Patient exhibits appropriate insight and judgment.   ____________________________________________  LABS (pertinent positives/negatives)  Labs Reviewed  URINALYSIS, COMPLETE (UACMP) WITH MICROSCOPIC - Abnormal; Notable for the following:       Result Value   Color, Urine YELLOW (*)    APPearance CLEAR (*)    Leukocytes, UA TRACE (*)    Squamous Epithelial / LPF 0-5 (*)    All other components within normal limits  DIFFERENTIAL - Abnormal; Notable for the following:    Neutro Abs 8.4 (*)    Lymphs Abs 0.7 (*)    All other components within normal limits  BASIC METABOLIC PANEL  CBC  INFLUENZA PANEL BY PCR (TYPE A & B)  POC URINE PREG, ED  POCT PREGNANCY, URINE    ____________________________________________    EKG I, Governor Rooks, MD, the attending physician have personally viewed and interpreted all ECGs.  82 bpm. Normal sinus rhythm. Narrow QRS. Normal axis. Normal ST and T-wave ____________________________________________  RADIOLOGY All Xrays were viewed by me. Imaging interpreted by Radiologist.  None __________________________________________  PROCEDURES  Procedure(s) performed: None  Critical Care performed: None  ____________________________________________   ED COURSE / ASSESSMENT AND PLAN  Pertinent labs & imaging results that were available during my care of the patient were reviewed by me and considered in my medical decision making (see chart for details).  Morgan Young is here with a complaint of body aches and soreness which she localizes to her back which tends to be worse after she lives at work, but she is also complaining of neck discomfort and intermittent headaches. It sounds like she has a history of headaches in the past but today feels a little bit more sore.  No fevers or congestion. Not  suspicious of a flu although it is flu season. She has stable vital signs here. She is overall well-appearing. There is no neurologic deficit based on complaint on physical exam.  I'm not suspicious of meningitis. I'm not suspicious of spinal abscess.  I discussed with her that on exam she definitely has palpable muscle spasms and it's possible that she also has systemic inflammation from early viral syndrome with one episode of vomiting this morning.  From a syncope standpoint, no cardiac symptoms, no ongoing symptoms, and vital signs stable. EKG is reassuring.  Name going to treat her conservatively for musculoskeletal pain and likely early viral syndrome. In any case, I did discuss return precautions, she has a primary care doctor at [facility] where she can follow up closely.  At this point I did not recommend neuro imaging after we discussed risk and benefit, we decided to hold off for now.    CONSULTATIONS:   None   Patient / Family / Caregiver informed of clinical course, medical decision-making process, and agree with plan.   I discussed return precautions, follow-up instructions, and discharge instructions with  patient and/or family.   ___________________________________________   FINAL CLINICAL IMPRESSION(S) / ED DIAGNOSES   Final diagnoses:  Musculoskeletal pain  Syncope, unspecified syncope type              Note: This dictation was prepared with Dragon dictation. Any transcriptional errors that result from this process are unintentional    Governor Rooks, MD 12/08/16 1222

## 2017-07-16 DIAGNOSIS — Z915 Personal history of self-harm: Secondary | ICD-10-CM | POA: Insufficient documentation

## 2017-07-16 DIAGNOSIS — IMO0002 Reserved for concepts with insufficient information to code with codable children: Secondary | ICD-10-CM | POA: Insufficient documentation

## 2017-07-16 LAB — HM PAP SMEAR: HM Pap smear: NEGATIVE

## 2018-06-29 ENCOUNTER — Ambulatory Visit
Admission: EM | Admit: 2018-06-29 | Discharge: 2018-06-29 | Disposition: A | Discharge status: Home / Self Care | Payer: Self-pay | Attending: Family Medicine | Admitting: Family Medicine

## 2018-06-29 ENCOUNTER — Other Ambulatory Visit: Payer: Self-pay

## 2018-06-29 ENCOUNTER — Encounter: Payer: Self-pay | Admitting: Emergency Medicine

## 2018-06-29 DIAGNOSIS — S39012A Strain of muscle, fascia and tendon of lower back, initial encounter: Secondary | ICD-10-CM

## 2018-06-29 MED ORDER — TIZANIDINE HCL 4 MG PO CAPS: 4.0000 mg | ORAL_CAPSULE | Freq: Three times a day (TID) | ORAL | 0 refills | Status: DC

## 2018-06-29 MED ORDER — NAPROXEN 500 MG PO TABS
500.0000 mg | ORAL_TABLET | Freq: Two times a day (BID) | ORAL | 0 refills | Status: DC
Start: 2018-06-29 — End: 2019-08-31

## 2018-06-29 NOTE — Discharge Instructions (Signed)
Apply ice 20 minutes out of every 2 hours 4-5 times daily for comfort. Use  Caution while taking muscle relaxers.  Do not perform activities requiring concentration or judgment and do not drive.  °

## 2018-06-29 NOTE — ED Provider Notes (Signed)
[facility]    CSN: [id number] Arrival date & time: 06/29/18  [id number]     History   Chief Complaint Chief Complaint  Patient presents with  . Back Pain    HPI Morgan Young is a 22 y.o. female.   HPI  -year-old female presents today with left-sided low back pain that she has had for 2 days.  She states today is worse.  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  Normally has a female counterpart . the coworker  was out sick so she had to do the job by herself.  Been for the last week.  Review of previous medical records she was seen January 2018 musculoskeletal pain and again on 04/04/2017 for acute bilateral thoracic pain.  She was treated at that time with Flexeril and ibuprofen which she states helped her very much.  No radicular symptoms.  If anything it does radiate to the thoracic region.  No incontinence and no urinary tract symptoms.        History reviewed. No pertinent past medical history.  There are no active problems to display for this patient.   History reviewed. No pertinent surgical history.  OB History   None      Home Medications    Prior to Admission medications   Medication Sig Start Date End Date Taking? Authorizing Provider  naproxen (NAPROSYN) 500 MG tablet Take 1 tablet (500 mg total) by mouth 2 (two) times daily with a meal. 06/29/18   Lutricia Feiloemer, Diarra Kos P, PA-C  tiZANidine (ZANAFLEX) 4 MG capsule Take 1 capsule (4 mg total) by mouth 3 (three) times daily. 06/29/18   Lutricia Feiloemer, Artemisa Sladek P, PA-C    Family History Family History  Problem Relation Age of Onset  . Asthma Mother   . Hypertension Mother   . Diabetes Mother   . Other Father        unknown medical history    Social History Social History   Tobacco Use  . Smoking status: Current Some Day Smoker  . Smokeless tobacco: Never Used  Substance Use Topics  . Alcohol use: No  . Drug use: No     Allergies   Patient has no  known allergies.   Review of Systems Review of Systems  Constitutional: Positive for activity change. Negative for appetite change, chills, fatigue and fever.  Musculoskeletal: Positive for back pain and myalgias.  All other systems reviewed and are negative.    Physical Exam Triage Vital Signs ED Triage Vitals [06/29/18 0933]  Enc Vitals Group     BP 121/66     Pulse Rate 66     Resp 16     Temp 98.4 F (36.9 C)     Temp Source Oral     SpO2 99 %     Weight 150 lb (68 kg)     Height 5\' 7"  (1.702 m)     Head Circumference      Peak Flow      Pain Score 9     Pain Loc      Pain Edu?      Excl. in GC?    No data found.  Updated Vital Signs BP 121/66 (BP Location: Left Arm)   Pulse 66   Temp 98.4 F (36.9 C) (Oral)   Resp 16   Ht 5\' 7"  (1.702 m)   Wt 150 lb (68 kg)   LMP 06/15/2018 (Approximate)  SpO2 99%   BMI 23.49 kg/m   Visual Acuity Right Eye Distance:   Left Eye Distance:   Bilateral Distance:    Right Eye Near:   Left Eye Near:    Bilateral Near:     Physical Exam  Constitutional: She is oriented to person, place, and time. She appears well-developed and well-nourished. No distress.  HENT:  Head: Normocephalic.  Eyes: Pupils are equal, round, and reactive to light. Right eye exhibits no discharge. Left eye exhibits no discharge.  Neck: Normal range of motion.  Musculoskeletal: She exhibits tenderness.  Examination of the lumbar spine was performed with Gunnar Fusi, CMA chaperone and assistant.  Has a level pelvis in stance.  Able To forward flex with her hands level of her knee.  Returning to an upright posture is slightly difficult.  C/O of left-sided lumbosacral pain with that maneuver.  Lateral flexion is good bilaterally with discomfort at the extremes particularly of leftward flexion.  She does have tenderness of the sacroiliac joint more on the left leading up into the lumbosacral paraspinous musculature.  Neurological: She is alert and oriented to  person, place, and time.  Skin: Skin is warm and dry. She is not diaphoretic.  Psychiatric: She has a normal mood and affect. Her behavior is normal. Judgment and thought content normal.  Nursing note and vitals reviewed.    UC Treatments / Results  Labs (all labs ordered are listed, but only abnormal results are displayed) Labs Reviewed - No data to display  EKG None  Radiology No results found.  Procedures Procedures (including critical care time)  Medications Ordered in UC Medications - No data to display  Initial Impression / Assessment and Plan / UC Course  I have reviewed the triage vital signs and the nursing notes.  Pertinent labs & imaging results that were available during my care of the patient were reviewed by me and considered in my medical decision making (see chart for details).     Plan: 1. Test/x-ray results and diagnosis reviewed with patient 2. rx as per orders; risks, benefits, potential side effects reviewed with patient 3. Recommend supportive treatment with symptom avoidance and rest.  Use ice for pain relief.  Prescribed Naprosyn and Zanaflex for muscle spasm and pain.  Will keep out of work for today and tomorrow and allow her to return to work.  Have asked her to talk with management regarding having help that she normally does. 4. F/u prn if symptoms worsen or don't improve  Final Clinical Impressions(s) / UC Diagnoses   Final diagnoses:  Strain of lumbar region, initial encounter     Discharge Instructions     Apply ice 20 minutes out of every 2 hours 4-5 times daily for comfort. Use  Caution while taking muscle relaxers.  Do not perform activities requiring concentration or judgment and do not drive.     ED Prescriptions    Medication Sig Dispense Auth. Provider   naproxen (NAPROSYN) 500 MG tablet Take 1 tablet (500 mg total) by mouth 2 (two) times daily with a meal. 60 tablet Ovid Curd P, PA-C   tiZANidine (ZANAFLEX) 4 MG  capsule Take 1 capsule (4 mg total) by mouth 3 (three) times daily. 21 capsule Lutricia Feil, PA-C     Controlled Substance Prescriptions [place] Controlled Substance Registry consulted? Not Applicable   Lutricia Feil, PA-C 06/29/18 1011

## 2018-06-29 NOTE — ED Triage Notes (Addendum)
Patient in today c/o lower back pain x 2 days. No injury noted. Patient denies urinary frequency for dysuria. Patient states that she does a lot of bending and lifting at work.

## 2019-05-17 ENCOUNTER — Emergency Department
Admission: EM | Admit: 2019-05-17 | Discharge: 2019-05-17 | Disposition: A | Discharge status: Home / Self Care | Payer: Self-pay | Attending: Emergency Medicine | Admitting: Emergency Medicine

## 2019-05-17 ENCOUNTER — Other Ambulatory Visit: Payer: Self-pay

## 2019-05-17 ENCOUNTER — Encounter: Payer: Self-pay | Admitting: Emergency Medicine

## 2019-05-17 DIAGNOSIS — F17200 Nicotine dependence, unspecified, uncomplicated: Secondary | ICD-10-CM | POA: Insufficient documentation

## 2019-05-17 DIAGNOSIS — Z79899 Other long term (current) drug therapy: Secondary | ICD-10-CM | POA: Insufficient documentation

## 2019-05-17 DIAGNOSIS — L509 Urticaria, unspecified: Secondary | ICD-10-CM | POA: Insufficient documentation

## 2019-05-17 MED ORDER — HYDROXYZINE HCL 50 MG PO TABS: 50.0000 mg | ORAL_TABLET | Freq: Three times a day (TID) | ORAL | 0 refills | Status: DC | PRN

## 2019-05-17 MED ORDER — METHYLPREDNISOLONE 4 MG PO TBPK: ORAL_TABLET | ORAL | 0 refills | Status: DC

## 2019-05-17 MED ORDER — DEXAMETHASONE SODIUM PHOSPHATE 10 MG/ML IJ SOLN
10.0000 mg | Freq: Once | INTRAMUSCULAR | Status: AC
  Administered 2019-05-17: 10 mg via INTRAMUSCULAR
  Filled 2019-05-17: qty 1

## 2019-05-17 MED ORDER — HYDROXYZINE HCL 50 MG PO TABS
50.0000 mg | ORAL_TABLET | Freq: Once | ORAL | Status: AC
  Administered 2019-05-17: 50 mg via ORAL
  Filled 2019-05-17: qty 1

## 2019-05-17 NOTE — ED Provider Notes (Signed)
[facility] Emergency Department Provider Note   ____________________________________________   First MD Initiated Contact with Patient 05/17/19 2017     (approximate)  I have reviewed the triage vital signs and the nursing notes.   HISTORY  Chief Complaint Allergic Reaction    HPI Morgan Young is a 23 y.o. female presents with a rash after taking a shower today.  Patient has just taken a shower she noticed itchy eyes and a rash appeared over her body.  Patient states no new personal hygiene products.  Patient states she is unsure of any new laundry products.  Patient denies any anaphylactic signs and symptoms.  Patient has a history of sensitive skin.  Patient took a Benadryl prior to arrival and states decreased itching.      History reviewed. No pertinent past medical history.  There are no active problems to display for this patient.   History reviewed. No pertinent surgical history.  Prior to Admission medications   Medication Sig Start Date End Date Taking? Authorizing Provider  hydrOXYzine (ATARAX/VISTARIL) 50 MG tablet Take 1 tablet (50 mg total) by mouth 3 (three) times daily as needed for itching. 05/17/19   Joni ReiningSmith, Liba Hulsey K, PA-C  methylPREDNISolone (MEDROL DOSEPAK) 4 MG TBPK tablet Take Tapered dose as directed 05/17/19   Joni ReiningSmith, Janelie Goltz K, PA-C  naproxen (NAPROSYN) 500 MG tablet Take 1 tablet (500 mg total) by mouth 2 (two) times daily with a meal. 06/29/18   Lutricia Feiloemer, William P, PA-C  tiZANidine (ZANAFLEX) 4 MG capsule Take 1 capsule (4 mg total) by mouth 3 (three) times daily. 06/29/18   Lutricia Feiloemer, William P, PA-C    Allergies Patient has no known allergies.  Family History  Problem Relation Age of Onset  . Asthma Mother   . Hypertension Mother   . Diabetes Mother   . Other Father        unknown medical history    Social History Social History   Tobacco Use  . Smoking status: Current Some Day Smoker  . Smokeless tobacco: Never Used   Substance Use Topics  . Alcohol use: No  . Drug use: No    Review of Systems Constitutional: No fever/chills Eyes: No visual changes. ENT: No sore throat. Cardiovascular: Denies chest pain. Respiratory: Denies shortness of breath. Gastrointestinal: No abdominal pain.  No nausea, no vomiting.  No diarrhea.  No constipation. Genitourinary: Negative for dysuria. Musculoskeletal: Negative for back pain. Skin: Positive for rash. Neurological: Negative for headaches, focal weakness or numbness.   ____________________________________________   PHYSICAL EXAM:  VITAL SIGNS: ED Triage Vitals  Enc Vitals Group     BP 05/17/19 2021 119/69     Pulse Rate 05/17/19 2021 77     Resp 05/17/19 2021 18     Temp 05/17/19 2021 98.6 F (37 C)     Temp Source 05/17/19 2021 Oral     SpO2 05/17/19 2021 98 %     Weight 05/17/19 2025 178 lb (80.7 kg)     Height 05/17/19 2025 5\' 7"  (1.702 m)     Head Circumference --      Peak Flow --      Pain Score 05/17/19 2024 0     Pain Loc --      Pain Edu? --      Excl. in GC? --    Constitutional: Alert and oriented. Well appearing and in no acute distress. Eyes: Conjunctivae are normal. PERRL. EOMI. Head: Atraumatic. Nose: No congestion/rhinnorhea. Mouth/Throat: Mucous  membranes are moist.  Oropharynx non-erythematous. Cardiovascular: Normal rate, regular rhythm. Grossly normal heart sounds.  Good peripheral circulation. Respiratory: Normal respiratory effort.  No retractions. Lungs CTAB. Neurologic:  Normal speech and language. No gross focal neurologic deficits are appreciated. No gait instability. Skin:  Skin is warm, dry and intact.  Diffuse macular lesions. Psychiatric: Mood and affect are normal. Speech and behavior are normal.  ____________________________________________   LABS (all labs ordered are listed, but only abnormal results are displayed)  Labs Reviewed - No data to display ____________________________________________  EKG    ____________________________________________  RADIOLOGY  ED MD interpretation:    Official radiology report(s): No results found.  ____________________________________________   PROCEDURES  Procedure(s) performed (including Critical Care):  Procedures   ____________________________________________   INITIAL IMPRESSION / ASSESSMENT AND PLAN / ED COURSE  As part of my medical decision making, I reviewed the following data within the Hand         Patient presents with allergic reaction presented as hives.  Patient has no anaphylactic signs or symptoms.  Patient given Decadron and Atarax.  Patient given discharge care instruction advised take medication as directed.  Patient vies return right ED if condition worsens.      ____________________________________________   FINAL CLINICAL IMPRESSION(S) / ED DIAGNOSES  Final diagnoses:  Urticaria     ED Discharge Orders         Ordered    methylPREDNISolone (MEDROL DOSEPAK) 4 MG TBPK tablet     05/17/19 2049    hydrOXYzine (ATARAX/VISTARIL) 50 MG tablet  3 times daily PRN     05/17/19 2049           Note:  This document was prepared using Dragon voice recognition software and may include unintentional dictation errors.    Sable Feil, PA-C 05/17/19 2053    Earleen Newport, MD 05/17/19 2130

## 2019-05-17 NOTE — ED Triage Notes (Signed)
Pt arrived via POV with reports of allergic reaction around 1900 after taking a shower. Pt reports itching eyes and itching rash all over. Pt states her eyes were watering and was scratching at eyes.  Pt took 25mg  PO benadryl and states sxs did improve but states eyes are still bothering her due to the itching.

## 2019-05-17 NOTE — ED Notes (Signed)
Reference triage note. Pt in NAD. Pt with even and unlabored respirations.

## 2019-07-19 ENCOUNTER — Other Ambulatory Visit: Payer: Self-pay

## 2019-08-31 ENCOUNTER — Other Ambulatory Visit: Payer: Self-pay

## 2019-08-31 ENCOUNTER — Encounter: Payer: Self-pay | Admitting: Emergency Medicine

## 2019-08-31 ENCOUNTER — Emergency Department
Admission: EM | Admit: 2019-08-31 | Discharge: 2019-08-31 | Disposition: A | Discharge status: Home / Self Care | Payer: Medicaid Other | Attending: Emergency Medicine | Admitting: Emergency Medicine

## 2019-08-31 ENCOUNTER — Emergency Department: Payer: Medicaid Other

## 2019-08-31 DIAGNOSIS — S93401A Sprain of unspecified ligament of right ankle, initial encounter: Secondary | ICD-10-CM | POA: Diagnosis not present

## 2019-08-31 DIAGNOSIS — S99911A Unspecified injury of right ankle, initial encounter: Secondary | ICD-10-CM | POA: Diagnosis present

## 2019-08-31 DIAGNOSIS — Y9301 Activity, walking, marching and hiking: Secondary | ICD-10-CM | POA: Insufficient documentation

## 2019-08-31 DIAGNOSIS — Y929 Unspecified place or not applicable: Secondary | ICD-10-CM | POA: Diagnosis not present

## 2019-08-31 DIAGNOSIS — Y999 Unspecified external cause status: Secondary | ICD-10-CM | POA: Diagnosis not present

## 2019-08-31 DIAGNOSIS — W010XXA Fall on same level from slipping, tripping and stumbling without subsequent striking against object, initial encounter: Secondary | ICD-10-CM | POA: Diagnosis not present

## 2019-08-31 DIAGNOSIS — F17210 Nicotine dependence, cigarettes, uncomplicated: Secondary | ICD-10-CM | POA: Insufficient documentation

## 2019-08-31 LAB — POC URINE PREG, ED: Preg Test, Ur: NEGATIVE

## 2019-08-31 MED ORDER — NAPROXEN 500 MG PO TABS: 500.0000 mg | ORAL_TABLET | Freq: Two times a day (BID) | ORAL | 0 refills | Status: DC

## 2019-08-31 NOTE — ED Notes (Signed)
Spoke with Merrilee Seashore, supervisor at [employer] [phone]. States they will not cover medical expenses at this time due to injury occurring x1wk ago, states pt will have to go through process at work after discharged and possibly be eligible for reimbursement. Pt notified and verbalizes understanding

## 2019-08-31 NOTE — Discharge Instructions (Addendum)
Follow-up with your primary care provider if any continued problems.  Ice and elevation to reduce swelling and help with pain.  Wear Ace wrap for added support.  Begin taking naproxen 500 mg twice daily with food for pain and inflammation.

## 2019-08-31 NOTE — ED Provider Notes (Signed)
[facility] Emergency Department Provider Note   ____________________________________________   First MD Initiated Contact with Patient 08/31/19 [phone]     (approximate)  I have reviewed the triage vital signs and the nursing notes.   HISTORY  Chief Complaint Ankle Pain   HPI Morgan Young is a 23 y.o. female presents to the ED with complaint of right ankle pain since last Wednesday.  Patient states that she was walking when her foot got caught in some straps around a box and she tripped.  She denies any actual fall and has continued to ambulate without assistance since that time.  Patient noted swelling.  She states that she did not report this accident at the time that it happened.  She states that she told "Gerald Stabs" about the accident 2 days ago.  She rates her pain as a 10/10.     History reviewed. No pertinent past medical history.  There are no active problems to display for this patient.   History reviewed. No pertinent surgical history.  Prior to Admission medications   Medication Sig Start Date End Date Taking? Authorizing Provider  naproxen (NAPROSYN) 500 MG tablet Take 1 tablet (500 mg total) by mouth 2 (two) times daily with a meal. 08/31/19   Johnn Hai, PA-C    Allergies Patient has no known allergies.  Family History  Problem Relation Age of Onset   Asthma Mother    Hypertension Mother    Diabetes Mother    Other Father        unknown medical history    Social History Social History   Tobacco Use   Smoking status: Current Some Day Smoker   Smokeless tobacco: Never Used  Substance Use Topics   Alcohol use: No   Drug use: No    Review of Systems Constitutional: No fever/chills Eyes: No visual changes. ENT: No sore throat. Cardiovascular: Denies chest pain. Respiratory: Denies shortness of breath. Musculoskeletal: Positive right ankle pain. Skin: Negative for discoloration. Neurological: Negative for   focal weakness or numbness. ____________________________________________   PHYSICAL EXAM:  VITAL SIGNS: ED Triage Vitals  Enc Vitals Group     BP 08/31/19 0800 119/73     Pulse Rate 08/31/19 0800 91     Resp 08/31/19 0800 16     Temp 08/31/19 0800 99.3 F (37.4 C)     Temp Source 08/31/19 0800 Oral     SpO2 08/31/19 0800 98 %     Weight 08/31/19 0801 185 lb (83.9 kg)     Height 08/31/19 0801 5\' 7"  (1.702 m)     Head Circumference --      Peak Flow --      Pain Score 08/31/19 0801 10     Pain Loc --      Pain Edu? --      Excl. in [place]? --    Constitutional: Alert and oriented. Well appearing and in no acute distress. Eyes: Conjunctivae are normal.  Head: Atraumatic. Neck: No stridor.   Cardiovascular:  Good peripheral circulation. Respiratory: Normal respiratory effort.  No retractions.  Musculoskeletal: On examination of the right ankle there is soft tissue edema present right lateral malleolus. Neurologic:  Normal speech and language. No gross focal neurologic deficits are appreciated. No gait instability. Skin:  Skin is warm, dry and intact.  No ecchymosis or abrasions are seen. Psychiatric: Mood and affect are normal. Speech and behavior are normal.  ____________________________________________   LABS (all labs ordered are listed,  but only abnormal results are displayed)  Labs Reviewed  POC URINE PREG, ED    RADIOLOGY  ED MD interpretation:  Right ankle x-rays negative for acute bony injury.  Official radiology report(s): Dg Ankle Complete Right  Result Date: 08/31/2019 CLINICAL DATA:  Right ankle pain for 1 week since a fall. Initial encounter. EXAM: RIGHT ANKLE - COMPLETE 3+ VIEW COMPARISON:  None. FINDINGS: There is no evidence of fracture, dislocation, or joint effusion. There is no evidence of arthropathy or other focal bone abnormality. There appears to be mild soft tissue swelling lateral side. IMPRESSION: Possible mild soft tissue swelling about the  lateral aspect of the ankle. The study is otherwise negative. Electronically Signed   By: Drusilla Kanner M.D.   On: 08/31/2019 08:47    ____________________________________________   PROCEDURES  Procedure(s) performed (including Critical Care):  Procedures   ____________________________________________   INITIAL IMPRESSION / ASSESSMENT AND PLAN / ED COURSE  As part of my medical decision making, I reviewed the following data within the electronic MEDICAL RECORD NUMBER Notes from prior ED visits and [place] Controlled Substance Database  23 year old female presents to the ED with complaint of right ankle pain.  Patient states that she tripped while at work last week.  She did not reported immediately but reported it to her supervisor Thayer Ohm 2 days ago.  Physical exam shows some soft tissue swelling on the lateral aspect.  Patient is amatory without any assistance and full range of motion was noted.  X-ray was negative for acute fracture.  Patient was made aware that this was more of a ankle sprain.  She was given a prescription for naproxen and an Ace wrap.  She was encouraged to use ice if needed for pain and to follow-up with her PCP if any continued problems.   ____________________________________________   FINAL CLINICAL IMPRESSION(S) / ED DIAGNOSES  Final diagnoses:  Sprain of right ankle, unspecified ligament, initial encounter     ED Discharge Orders         Ordered    naproxen (NAPROSYN) 500 MG tablet  2 times daily with meals     08/31/19 0907           Note:  This document was prepared using Dragon voice recognition software and may include unintentional dictation errors.    Tommi Rumps, PA-C 08/31/19 1519    Emily Filbert, MD 09/01/19 1505

## 2019-08-31 NOTE — ED Notes (Signed)
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

## 2019-08-31 NOTE — ED Triage Notes (Signed)
Pt here with c/o right ankle pain since last Wed, is filing workers comp if not too late, denies injury to area, however states pain began at work last week. Walk with slight limp to triage.

## 2019-09-14 DIAGNOSIS — Z915 Personal history of self-harm: Secondary | ICD-10-CM

## 2019-09-14 DIAGNOSIS — IMO0002 Reserved for concepts with insufficient information to code with codable children: Secondary | ICD-10-CM

## 2019-09-19 ENCOUNTER — Ambulatory Visit (LOCAL_COMMUNITY_HEALTH_CENTER): Payer: Self-pay

## 2019-09-19 ENCOUNTER — Other Ambulatory Visit: Payer: Self-pay

## 2019-09-19 VITALS — BP 114/66 | Ht 66.5 in | Wt 187.0 lb

## 2019-09-19 DIAGNOSIS — Z3201 Encounter for pregnancy test, result positive: Secondary | ICD-10-CM

## 2019-09-19 LAB — PREGNANCY, URINE: Preg Test, Ur: POSITIVE — AB

## 2019-09-19 MED ORDER — PRENATAL VITAMIN 27-0.8 MG PO TABS: 1.0000 | ORAL_TABLET | Freq: Every day | ORAL | 0 refills | Status: DC

## 2019-09-19 NOTE — Progress Notes (Signed)
Per client, has three pages of food allergies but did not bring with her to appt today. Encouraged to take with her when goes for initial OB appt at [facility]. Rich Number, RN

## 2019-10-13 ENCOUNTER — Other Ambulatory Visit: Payer: Self-pay

## 2019-10-13 ENCOUNTER — Emergency Department: Payer: Medicaid Other

## 2019-10-13 ENCOUNTER — Encounter: Payer: Self-pay | Admitting: Emergency Medicine

## 2019-10-13 ENCOUNTER — Emergency Department
Admission: EM | Admit: 2019-10-13 | Discharge: 2019-10-13 | Disposition: A | Discharge status: Home / Self Care | Payer: Medicaid Other | Attending: Emergency Medicine | Admitting: Emergency Medicine

## 2019-10-13 DIAGNOSIS — Z3491 Encounter for supervision of normal pregnancy, unspecified, first trimester: Secondary | ICD-10-CM

## 2019-10-13 DIAGNOSIS — Z79899 Other long term (current) drug therapy: Secondary | ICD-10-CM | POA: Insufficient documentation

## 2019-10-13 DIAGNOSIS — O26899 Other specified pregnancy related conditions, unspecified trimester: Secondary | ICD-10-CM

## 2019-10-13 DIAGNOSIS — R109 Unspecified abdominal pain: Secondary | ICD-10-CM | POA: Insufficient documentation

## 2019-10-13 DIAGNOSIS — O99891 Other specified diseases and conditions complicating pregnancy: Secondary | ICD-10-CM | POA: Diagnosis present

## 2019-10-13 DIAGNOSIS — N939 Abnormal uterine and vaginal bleeding, unspecified: Secondary | ICD-10-CM

## 2019-10-13 DIAGNOSIS — Z3A10 10 weeks gestation of pregnancy: Secondary | ICD-10-CM | POA: Insufficient documentation

## 2019-10-13 LAB — POCT PREGNANCY, URINE: Preg Test, Ur: POSITIVE — AB

## 2019-10-13 LAB — BASIC METABOLIC PANEL
Anion gap: 9 (ref 5–15)
BUN: 7 mg/dL (ref 6–20)
CO2: 21 mmol/L — ABNORMAL LOW (ref 22–32)
Calcium: 9.2 mg/dL (ref 8.9–10.3)
Chloride: 102 mmol/L (ref 98–111)
Creatinine, Ser: 0.59 mg/dL (ref 0.44–1.00)
GFR calc Af Amer: >60 mL/min (ref >60)
GFR calc non Af Amer: >60 mL/min (ref >60)
Glucose, Bld: 102 mg/dL — ABNORMAL HIGH (ref 70–99)
Potassium: 3.4 mmol/L — ABNORMAL LOW (ref 3.5–5.1)
Sodium: 132 mmol/L — ABNORMAL LOW (ref 135–145)

## 2019-10-13 LAB — CBC
HCT: 35.3 % — ABNORMAL LOW (ref 36.0–46.0)
Hemoglobin: 12.5 g/dL (ref 12.0–15.0)
MCH: 29.8 pg (ref 26.0–34.0)
MCHC: 35.4 g/dL (ref 30.0–36.0)
MCV: 84.2 fL (ref 80.0–100.0)
Platelets: 242 10*3/uL (ref 150–400)
RBC: 4.19 MIL/uL (ref 3.87–5.11)
RDW: 12 % (ref 11.5–15.5)
WBC: 5.7 10*3/uL (ref 4.0–10.5)
nRBC: 0 % (ref 0.0–0.2)

## 2019-10-13 LAB — PREGNANCY, URINE: Preg Test, Ur: POSITIVE — AB

## 2019-10-13 LAB — HCG, QUANTITATIVE, PREGNANCY: hCG, Beta Chain, Quant, S: 83840 m[IU]/mL — ABNORMAL HIGH

## 2019-10-13 NOTE — Progress Notes (Signed)
Chart abstracted per 10/12/19 phone interview with Tawanna Solo, RN; Debera Lat, RN

## 2019-10-13 NOTE — Discharge Instructions (Addendum)
Keep your appointment with the health department and take your test results with you. Return to the emergency department if any severe worsening of your symptoms or urgent concerns.  Also increase green vegetables, bananas, oranges to increase your potassium level.

## 2019-10-13 NOTE — ED Notes (Signed)
See triage note  Presents with some abd cramping  States she recently found out that she was preg  States cramping is sharp  No vaginal bleeding

## 2019-10-13 NOTE — ED Triage Notes (Signed)
Pt here with c/o abdominal cramping "sharp" without vaginal bleeding today, is [redacted] weeks pregnant with first baby. NAD.

## 2019-10-13 NOTE — ED Provider Notes (Signed)
[facility] Emergency Department Provider Note   ____________________________________________   First MD Initiated Contact with Patient 10/13/19 1142     (approximate)  I have reviewed the triage vital signs and the nursing notes.   HISTORY  Chief Complaint Abdominal Cramping   HPI Morgan Young is a 23 y.o. female presents to the ED with complaint of abdominal cramping.  Patient states that there is been no fever, chills, nausea or vomiting.  She denies any diarrhea, urinary symptoms, vaginal discharge.  Patient states she believes that she is approximately [redacted] weeks pregnant with her first child.  She describes her abdominal pain as a "cramping sensation".  She denies any trauma.  She rates her pain as an 8 out of 10.       Past Medical History:  Diagnosis Date   History of self-harm    History of urinary tract infection    Scoliosis     Patient Active Problem List   Diagnosis Date Noted   H/O self-harm 07/16/2017    Past Surgical History:  Procedure Laterality Date   Denies surgical hx      Prior to Admission medications   Medication Sig Start Date End Date Taking? Authorizing Provider  Prenatal Vit-Fe Fumarate-FA (PRENATAL VITAMIN) 27-0.8 MG TABS Take 1 tablet by mouth daily at 6 (six) AM. 09/19/19   Caren Macadam, MD    Allergies Patient has no known allergies.  Family History  Problem Relation Age of Onset   Asthma Mother    Hypertension Mother    Diabetes Mother    Hyperlipidemia Mother    Other Father        unknown medical history   Hypertension Maternal Grandmother    Hyperlipidemia Maternal Grandmother     Social History Social History   Tobacco Use   Smoking status: Never Smoker   Smokeless tobacco: Never Used  Substance Use Topics   Alcohol use: Not Currently    Comment: Last ETOH use 3 - 4 weeks ago   Drug use: Not Currently    Types: Marijuana    Comment: Last use January or February  2020.    Review of Systems Constitutional: No fever/chills Eyes: No visual changes. ENT: No sore throat. Cardiovascular: Denies chest pain. Respiratory: Denies shortness of breath. Gastrointestinal: Positive abdominal pain.  No nausea, no vomiting.  No diarrhea.  No constipation. Genitourinary: Negative for dysuria. Musculoskeletal: Negative for back pain. Skin: Negative for rash. Neurological: Negative for headaches, focal weakness or numbness. ___________________________________________   PHYSICAL EXAM:  VITAL SIGNS: ED Triage Vitals  Enc Vitals Group     BP 10/13/19 0818 (!) 118/57     Pulse Rate 10/13/19 0818 74     Resp 10/13/19 0818 18     Temp 10/13/19 0818 99.2 F (37.3 C)     Temp Source 10/13/19 0818 Oral     SpO2 10/13/19 0818 99 %     Weight 10/13/19 0818 185 lb (83.9 kg)     Height 10/13/19 0818 5\' 7"  (1.702 m)     Head Circumference --      Peak Flow --      Pain Score 10/13/19 0821 8     Pain Loc --      Pain Edu? --      Excl. in [place]? --     Constitutional: Alert and oriented. Well appearing and in no acute distress. Eyes: Conjunctivae are normal. PERRL. EOMI. Head: Atraumatic. Nose: No congestion/rhinnorhea. Mouth/Throat:  Mucous membranes are moist.  Oropharynx non-erythematous. Neck: No stridor.   Cardiovascular: Normal rate, regular rhythm. Grossly normal heart sounds.  Good peripheral circulation. Respiratory: Normal respiratory effort.  No retractions. Lungs CTAB. Gastrointestinal: Soft and nontender. No distention.  No CVA tenderness. Musculoskeletal: No lower extremity tenderness nor edema.  No joint effusions. Neurologic:  Normal speech and language. No gross focal neurologic deficits are appreciated. No gait instability. Skin:  Skin is warm, dry and intact. No rash noted. Psychiatric: Mood and affect are normal. Speech and behavior are normal.  ____________________________________________   LABS (all labs ordered are listed, but only  abnormal results are displayed)  Labs Reviewed  CBC - Abnormal; Notable for the following components:      Result Value   HCT 35.3 (*)    All other components within normal limits  BASIC METABOLIC PANEL - Abnormal; Notable for the following components:   Sodium 132 (*)    Potassium 3.4 (*)    CO2 21 (*)    Glucose, Bld 102 (*)    All other components within normal limits  PREGNANCY, URINE - Abnormal; Notable for the following components:   Preg Test, Ur POSITIVE (*)    All other components within normal limits  HCG, QUANTITATIVE, PREGNANCY - Abnormal; Notable for the following components:   hCG, Beta Chain, Quant, S 83,840 (*)    All other components within normal limits  POCT PREGNANCY, URINE - Abnormal; Notable for the following components:   Preg Test, Ur POSITIVE (*)    All other components within normal limits    RADIOLOGY  Official radiology report(s): [place] Ob Less Than 14 Weeks With Ob Transvaginal  Result Date: 10/13/2019 CLINICAL DATA:  23 year old pregnant female with vaginal bleeding. LMP: 08/01/2019 corresponding to an estimated gestational age of [redacted] weeks, 3 days. EXAM: OBSTETRIC <14 WK US AND TRANSVAGINAL OB US TECHNIQUE: Both transabdominal and transvaginal ultrasound examinations were performed for complete evaluation of the gestation as well as the maternal uterus, adnexal regions, and pelvic cul-de-sac. Transvaginal technique was performed to assess early pregnancy. COMPARISON:  None. FINDINGS: Intrauterine gestational sac: Single intrauterine gestational sac. Yolk sac:  Seen Embryo:  Present Cardiac Activity: Detected Heart Rate: 173 bpm CRL:  28 mm   9 w   4 d                  US EDC: 05/14/2019 Subchorionic hemorrhage: Vascular channel versus a small subchorionic hemorrhage measuring 12 x 3 mm. Maternal uterus/adnexae: The ovaries are unremarkable. IMPRESSION: Single live intrauterine pregnancy with an estimated gestational age of [redacted] weeks, 4 days. Electronically  Signed   By: Elgie CollardArash  Radparvar M.D.   On: 10/13/2019 10:04    ____________________________________________   PROCEDURES  Procedure(s) performed (including Critical Care):  Procedures   ____________________________________________   INITIAL IMPRESSION / ASSESSMENT AND PLAN / ED COURSE  As part of my medical decision making, I reviewed the following data within the electronic MEDICAL RECORD NUMBER Notes from prior ED visits and [place] Controlled Substance Database  23 year old female presents to the ED with complaint of abdominal cramping without vaginal bleeding.  Patient believes that she is approximately [redacted] weeks pregnant.  She denies any vaginal bleeding, urinary symptoms or fever/chills.  Lab work is reassuring and transvaginal ultrasound shows a single IUP at 9 weeks.  A copy of her lab work was sent with the patient so that she would have this for her first appointment.  ____________________________________________   FINAL CLINICAL IMPRESSION(S) /  ED DIAGNOSES  Final diagnoses:  First trimester pregnancy     ED Discharge Orders    None       Note:  This document was prepared using Dragon voice recognition software and may include unintentional dictation errors.    Tommi Rumps, PA-C 10/13/19 1548    Sharyn Creamer, MD 10/13/19 [phone]

## 2019-10-17 ENCOUNTER — Other Ambulatory Visit: Payer: Self-pay | Admitting: Family Medicine

## 2019-10-17 ENCOUNTER — Other Ambulatory Visit: Payer: Self-pay

## 2019-10-17 ENCOUNTER — Ambulatory Visit: Payer: Medicaid Other | Admitting: Family Medicine

## 2019-10-17 VITALS — BP 111/58 | Temp 97.9°F | Wt 188.4 lb

## 2019-10-17 DIAGNOSIS — Z3401 Encounter for supervision of normal first pregnancy, first trimester: Secondary | ICD-10-CM | POA: Insufficient documentation

## 2019-10-17 DIAGNOSIS — Z23 Encounter for immunization: Secondary | ICD-10-CM

## 2019-10-17 DIAGNOSIS — Z3402 Encounter for supervision of normal first pregnancy, second trimester: Secondary | ICD-10-CM | POA: Insufficient documentation

## 2019-10-17 DIAGNOSIS — Z3400 Encounter for supervision of normal first pregnancy, unspecified trimester: Secondary | ICD-10-CM

## 2019-10-17 LAB — URINALYSIS
Bilirubin, UA: NEGATIVE
Glucose, UA: NEGATIVE
Ketones, UA: NEGATIVE
Leukocytes,UA: NEGATIVE
Nitrite, UA: NEGATIVE
Protein,UA: NEGATIVE
RBC, UA: NEGATIVE
Specific Gravity, UA: 1.015 (ref 1.005–1.030)
Urobilinogen, Ur: 0.2 mg/dL (ref 0.2–1.0)
pH, UA: 7 (ref 5.0–7.5)

## 2019-10-17 LAB — HEMOGLOBIN, FINGERSTICK: Hemoglobin: 12.9 g/dL (ref 11.1–15.9)

## 2019-10-17 LAB — OB RESULTS CONSOLE HIV ANTIBODY (ROUTINE TESTING): HIV: NONREACTIVE

## 2019-10-17 NOTE — Progress Notes (Signed)
Patient given [facility] pager card. Patient wants Quad screen and would like to do it at next in clinic appointment. Patient given the option of coming for lab appointment in between monthly appointments to have Quad, and Quad screen options explained, patient declined to come for lab draw. Patient scheduled for 4 week telehealth appointment and procedure was explained to patient. Patient states understanding. Patient declined flu vaccine today. Patient did not receive "Having your baby at [facility]" booklet in her new OB packet, as we do not have any in English at this time.Marland KitchenMarland KitchenMarland KitchenJenetta Downer, RN

## 2019-10-17 NOTE — Progress Notes (Signed)
[facility] HEALTH DEPT [facility] [address] [place] [address] [phone]  INITIAL PRENATAL VISIT NOTE  Subjective:  Morgan Young is a 23 y.o. G1P0000 at [redacted]w[redacted]d being seen today to start prenatal care at the [facility] Department.  She is currently monitored for the following issues for this low-risk pregnancy and has H/O self-harm and Encounter for supervision of normal first pregnancy in first trimester on their problem list.  Initial OB visit with pt. She states she feels really happy and a little nervous about this pregnancy. Pt was between depo injections when she found out she was pregnant. She lives in [place] with her boyfriend and works at [employer]. Her job is new and requires heavy lifting/pushing which hurts her back and she gets occasional static shocks. She is actively looking for different work. Denies alcohol, tobacco or substance use. Her boyfriend works at [employer]. He smokes marijuana but not around pt.   She was in the ER 11/19 w/abdominal cramping, no bleeding. [place] at ER showed single live intrauterine pregnancy. EGA of [redacted]w[redacted]d on 10/13/19. She continues to have occasional cramping since this visit, worse at night. Takes extra strength tylenol with little relief.   Contractions: Not present. Vag. Bleeding: None.   . Denies leaking of fluid.   The following portions of the patient's history were reviewed and updated as appropriate: allergies, current medications, past family history, past medical history, past social history, past surgical history and problem list. Problem list updated.  Objective:   Vitals:   10/17/19 0922  BP: (!) 111/58  Temp: 97.9 F (36.6 C)  Weight: 188 lb 6.4 oz (85.5 kg)    Fetal Status: Fetal Heart Rate (bpm): 168         Physical Exam Vitals signs and nursing note reviewed.  Constitutional:      General: She is not in acute distress.    Appearance: Normal  appearance. She is well-developed.  HENT:     Head: Normocephalic and atraumatic.     Right Ear: External ear normal.     Left Ear: External ear normal.     Nose: Nose normal. No congestion or rhinorrhea.     Mouth/Throat:     Lips: Pink.     Mouth: Mucous membranes are moist.     Dentition: Normal dentition. No dental caries.  Eyes:     General: No scleral icterus.    Conjunctiva/sclera: Conjunctivae normal.  Neck:     Thyroid: No thyroid mass or thyromegaly.  Cardiovascular:     Rate and Rhythm: Normal rate.     Pulses: Normal pulses.     Comments: Extremities are warm and well perfused Pulmonary:     Effort: Pulmonary effort is normal.     Breath sounds: Normal breath sounds.  Chest:     Breasts: Breasts are symmetrical.        Right: Normal. No mass, nipple discharge or skin change.        Left: Normal. No mass, nipple discharge or skin change.  Abdominal:     General: Abdomen is flat.     Palpations: Abdomen is soft.     Tenderness: There is no abdominal tenderness.     Comments: Gravid   Genitourinary:    General: Normal vulva.     Exam position: Lithotomy position.     Pubic Area: No rash.      Labia:        Right:  No rash.        Left: No rash.      Vagina: Normal. No vaginal discharge.     Cervix: No cervical motion tenderness or friability.     Uterus: Normal. Enlarged (Gravid 10-11 wk size). Not tender.      Adnexa: Right adnexa normal and left adnexa normal.     Rectum: Normal. No external hemorrhoid.  Musculoskeletal:     Right lower leg: No edema.     Left lower leg: No edema.  Lymphadenopathy:     Upper Body:     Right upper body: No axillary adenopathy.     Left upper body: No axillary adenopathy.  Skin:    General: Skin is warm.     Capillary Refill: Capillary refill takes less than 2 seconds.  Neurological:     Mental Status: She is alert.     Assessment and Plan:  Pregnancy: G1P0000 at [redacted]w[redacted]d  1. Supervision of normal first pregnancy,  antepartum Initial visit for low risk pregnancy.  Advised tylenol, baths, rest for abdominal cramping. Pt to return to ER if cramping worsens or any bleeding.  She is actively looking for less physical work. Encouraged her to speak with management about back pain and static shocks with work. Pt desires quad screening, will get at 18 wk visit. BMI 29.5. Discussed optimal weight gain in pregnancy, offered nutritionist, pt declines. - HGB FRAC. W/SOLUBILITY - Urine Culture - Chlamydia/GC NAA, Confirmation - HIV [facility] - Prenatal profile without Varicella/Rubella ([id number]) - Hemoglobin, venipuncture - Urinalysis (Urine Dip)    Discussed overview of care and coordination with inpatient delivery practices including [facility], [facility], [facility] and [facility] [facility].   Reviewed [facility] pregnancy as standard of care at [facility] she declines   Preterm labor symptoms and general obstetric precautions including but not limited to vaginal bleeding, contractions, leaking of fluid and fetal movement were reviewed in detail with the patient.  Please refer to After Visit Summary for other counseling recommendations.   Return in about 4 weeks (around 11/14/2019) for routine prenatal care.  Future Appointments  Date Time Provider [place]  11/15/2019  8:20 AM [facility] PROVIDER [facility] None    Kandee Keen, PA-C

## 2019-10-17 NOTE — Progress Notes (Signed)
Patient here for new OB, patient was about 30 minutes late for appointment. Patient went to [facility] ED on 10/13/2019 due to cramping, denies bleeding, and had U/S. Patient states she has scoliosis, and has a heavy lifting job--has questions about current job and her duties. Also states she had Pap test here 2018.Marland KitchenJenetta Downer, RN

## 2019-10-18 LAB — CBC/D/PLT+RPR+RH+ABO+AB SCR
Antibody Screen: NEGATIVE
Basophils Absolute: 0 10*3/uL (ref 0.0–0.2)
Basos: 0 %
EOS (ABSOLUTE): 0.1 10*3/uL (ref 0.0–0.4)
Eos: 2 %
Hematocrit: 38.5 % (ref 34.0–46.6)
Hemoglobin: 12.9 g/dL (ref 11.1–15.9)
Hepatitis B Surface Ag: NEGATIVE
Immature Grans (Abs): 0 10*3/uL (ref 0.0–0.1)
Immature Granulocytes: 0 %
Lymphocytes Absolute: 1.5 10*3/uL (ref 0.7–3.1)
Lymphs: 28 %
MCH: 30.7 pg (ref 26.6–33.0)
MCHC: 33.5 g/dL (ref 31.5–35.7)
MCV: 92 fL (ref 79–97)
Monocytes Absolute: 0.4 10*3/uL (ref 0.1–0.9)
Monocytes: 7 %
Neutrophils Absolute: 3.4 10*3/uL (ref 1.4–7.0)
Neutrophils: 63 %
Platelets: 247 10*3/uL (ref 150–450)
RBC: 4.2 x10E6/uL (ref 3.77–5.28)
RDW: 12.7 % (ref 11.7–15.4)
RPR Ser Ql: NONREACTIVE
Rh Factor: POSITIVE
WBC: 5.4 10*3/uL (ref 3.4–10.8)

## 2019-10-18 LAB — HGB FRAC. W/SOLUBILITY
Hgb A2 Quant: 2.4 % (ref 1.8–3.2)
Hgb A: 97.6 % (ref 96.4–98.8)
Hgb C: 0 %
Hgb F Quant: 0 % (ref 0.0–2.0)
Hgb S: 0 %
Hgb Solubility: NEGATIVE
Hgb Variant: 0 %

## 2019-10-18 NOTE — Addendum Note (Signed)
Addended by: Jenetta Downer on: 10/18/2019 08:18 AM   Modules accepted: Orders

## 2019-10-19 LAB — URINE CULTURE

## 2019-10-20 LAB — CHLAMYDIA/GC NAA, CONFIRMATION
Chlamydia trachomatis, NAA: NEGATIVE
Neisseria gonorrhoeae, NAA: NEGATIVE

## 2019-11-15 ENCOUNTER — Ambulatory Visit: Payer: Self-pay

## 2019-11-15 ENCOUNTER — Telehealth: Payer: Self-pay

## 2019-11-15 NOTE — Telephone Encounter (Signed)
TC x 2 to patient at home number, LM x 2 with number to call. Patient needs to reschedule appointment. Jenetta Downer, RN

## 2019-11-23 ENCOUNTER — Ambulatory Visit: Payer: Medicaid Other | Admitting: Family Medicine

## 2019-11-23 ENCOUNTER — Other Ambulatory Visit: Payer: Self-pay

## 2019-11-23 ENCOUNTER — Encounter: Payer: Self-pay | Admitting: Family Medicine

## 2019-11-23 DIAGNOSIS — Z3401 Encounter for supervision of normal first pregnancy, first trimester: Secondary | ICD-10-CM | POA: Diagnosis not present

## 2019-11-23 NOTE — Progress Notes (Signed)
Here today for 16.2 week MH RV. Taking PNV 4-5x weekly. Denies ED/hospital visits since last RV. Quad Screen today. Hal Morales, RN

## 2019-11-23 NOTE — Progress Notes (Signed)
  PRENATAL VISIT NOTE  Subjective:  Morgan Young is a 23 y.o. G1P0000 at [redacted]w[redacted]d being seen today for ongoing prenatal care.  She is currently monitored for the following issues for this low-risk pregnancy and has H/O self-harm and Encounter for supervision of normal first pregnancy in first trimester on their problem list.  Patient reports continues to have intermittent lower abd cramping, present since early pregnancy. Denies bleeding, severe cramping, n/v, fever. Takes tylenol which helps somewhat.   Contractions: Not present. Vag. Bleeding: None.   . Denies leaking of fluid/ROM.     The following portions of the patient's history were reviewed and updated as appropriate: allergies, current medications, past family history, past medical history, past social history, past surgical history and problem list. Problem list updated.  Objective:   Vitals:   11/23/19 1558  BP: 114/67  Pulse: 88  Temp: 98.2 F (36.8 C)  Weight: 192 lb (87.1 kg)    Fetal Status: Fetal Heart Rate (bpm): 150 Fundal Height: 16 cm       General:  Alert, oriented and cooperative. Patient is in no acute distress.  Skin: Skin is warm and dry. No rash noted.   Cardiovascular: Normal heart rate noted  Respiratory: Normal respiratory effort, no problems with respiration noted  Abdomen: Soft, gravid, appropriate for gestational age.  Pain/Pressure: Absent     Pelvic: Cervical exam deferred        Extremities: Normal range of motion.  Edema: None  Mental Status: Normal mood and affect. Normal behavior. Normal judgment and thought content.   Assessment and Plan:  Pregnancy: G1P0000 at [redacted]w[redacted]d   1. Encounter for supervision of normal first pregnancy in first trimester -Up to date.  -Quad screen today.  -Referral placed for anatomy u/s in 2 wks w/[facility]. -Baths, heating pad, tylenol for cramping, let us know if does not resolve or worsens. - QUAD Screen [facility] Only   Preterm labor symptoms and general obstetric  precautions including but not limited to vaginal bleeding, contractions, leaking of fluid and fetal movement were reviewed in detail with the patient. Please refer to After Visit Summary for other counseling recommendations.  Return in about 4 weeks (around 12/21/2019) for routine prenatal care.  Future Appointments  Date Time Provider [place]  12/21/2019  4:00 PM [facility] PROVIDER [facility] None    Kandee Keen, PA-C

## 2019-11-23 NOTE — Progress Notes (Signed)
[facility] U/S referral faxed, confirmation received.

## 2019-12-19 ENCOUNTER — Telehealth: Payer: Self-pay

## 2019-12-19 NOTE — Telephone Encounter (Signed)
TC to patient after receiving message patient had called to say she has covid. Patient states she had rapid covid test today at [facility], and it was positive. Patient states she and boyfriend were around someone on 12/10/2019 who tested positive. She states she lost her sense of taste and smell on 12/14/2019 and went for covid test today. Patient counseled that she and boyfriend need to stay home for 10 days after symptoms began and patient states she was told not to return to work before 12/29/2019. Patient rescheduled for MH RV for 12/28/19, 14 days after symptom onset. Patient will follow-up with [facility] on 12/27/2019 to satisfy her boss. Patient told to call [facility] if symptoms not resolved by 12/28/2019. Patient agrees with plan.Burt Knack, RN

## 2019-12-21 ENCOUNTER — Ambulatory Visit: Payer: Medicaid Other

## 2019-12-28 ENCOUNTER — Ambulatory Visit: Payer: Medicaid Other

## 2019-12-30 ENCOUNTER — Other Ambulatory Visit: Payer: Self-pay

## 2019-12-30 ENCOUNTER — Ambulatory Visit: Payer: Medicaid Other | Admitting: Physician Assistant

## 2019-12-30 ENCOUNTER — Encounter: Payer: Self-pay | Admitting: Physician Assistant

## 2019-12-30 DIAGNOSIS — Z3402 Encounter for supervision of normal first pregnancy, second trimester: Secondary | ICD-10-CM

## 2019-12-30 NOTE — Progress Notes (Addendum)
Patient here for MH RV at 21 4/7. States she is taking gummy PNV due to size of regular prenatal vitamins. Patient declines flu vaccine today. Patient off isolation for covid, and plans to return to work on Monday. Peds list and [facility] pamphlet given today.Burt Knack, RN

## 2019-12-30 NOTE — Progress Notes (Signed)
   PRENATAL VISIT NOTE  Subjective:  Morgan Young is a 24 y.o. G1P0000 at [redacted]w[redacted]d being seen today for ongoing prenatal care.  She is currently monitored for the following issues for this low-risk pregnancy and has H/O self-harm and Encounter for supervision of normal first pregnancy in second trimester on their problem list.  Patient reports she had a mild COVID infection recently, had neg f/u testing and has been cleared to return to work. Had HA, loss of taste & smell. Feels well now..  Contractions: Not present. Vag. Bleeding: None.  Movement: Present. Denies leaking of fluid/ROM.   The following portions of the patient's history were reviewed and updated as appropriate: allergies, current medications, past family history, past medical history, past social history, past surgical history and problem list. Problem list updated.  Objective:   Vitals:   12/30/19 1311  BP: 105/64  Pulse: 86  Temp: (!) 97.2 F (36.2 C)  Weight: 196 lb 3.2 oz (89 kg)    Fetal Status: Fetal Heart Rate (bpm): 132 Fundal Height: 22 cm Movement: Present     General:  Alert, oriented and cooperative. Patient is in no acute distress.  Skin: Skin is warm and dry. No rash noted.   Cardiovascular: Normal heart rate noted  Respiratory: Normal respiratory effort, no problems with respiration noted  Abdomen: Soft, gravid, appropriate for gestational age.  Pain/Pressure: Absent     Pelvic: Cervical exam deferred        Extremities: Normal range of motion.  Edema: None  Mental Status: Normal mood and affect. Normal behavior. Normal judgment and thought content.   Assessment and Plan:  Pregnancy: G1P0000 at [redacted]w[redacted]d  1. Encounter for supervision of normal first pregnancy in second trimester Reviewed 12/14/19 normal anat [place] with pt. She is having a girl!  Preterm labor symptoms and general obstetric precautions including but not limited to vaginal bleeding, contractions, leaking of fluid and fetal movement were  reviewed in detail with the patient. Please refer to After Visit Summary for other counseling recommendations.  Return in about 4 weeks (around 01/27/2020) for Routine prenatal care.  No future appointments.  Landry Dyke, PA-C

## 2020-01-03 ENCOUNTER — Encounter: Payer: Self-pay | Admitting: Emergency Medicine

## 2020-01-03 ENCOUNTER — Other Ambulatory Visit: Payer: Self-pay

## 2020-01-03 ENCOUNTER — Emergency Department
Admission: EM | Admit: 2020-01-03 | Discharge: 2020-01-03 | Disposition: A | Discharge status: Home / Self Care | Payer: Medicaid Other | Attending: Emergency Medicine | Admitting: Emergency Medicine

## 2020-01-03 DIAGNOSIS — O99891 Other specified diseases and conditions complicating pregnancy: Secondary | ICD-10-CM | POA: Diagnosis not present

## 2020-01-03 DIAGNOSIS — R55 Syncope and collapse: Secondary | ICD-10-CM | POA: Insufficient documentation

## 2020-01-03 DIAGNOSIS — Z3A22 22 weeks gestation of pregnancy: Secondary | ICD-10-CM | POA: Insufficient documentation

## 2020-01-03 DIAGNOSIS — R42 Dizziness and giddiness: Secondary | ICD-10-CM | POA: Diagnosis not present

## 2020-01-03 DIAGNOSIS — Z79899 Other long term (current) drug therapy: Secondary | ICD-10-CM | POA: Insufficient documentation

## 2020-01-03 LAB — BASIC METABOLIC PANEL
Anion gap: 10 (ref 5–15)
BUN: 7 mg/dL (ref 6–20)
CO2: 21 mmol/L — ABNORMAL LOW (ref 22–32)
Calcium: 8.9 mg/dL (ref 8.9–10.3)
Chloride: 103 mmol/L (ref 98–111)
Creatinine, Ser: 0.39 mg/dL — ABNORMAL LOW (ref 0.44–1.00)
GFR calc Af Amer: >60 mL/min (ref >60)
GFR calc non Af Amer: >60 mL/min (ref >60)
Glucose, Bld: 86 mg/dL (ref 70–99)
Potassium: 3.5 mmol/L (ref 3.5–5.1)
Sodium: 134 mmol/L — ABNORMAL LOW (ref 135–145)

## 2020-01-03 LAB — CBC
HCT: 31.7 % — ABNORMAL LOW (ref 36.0–46.0)
Hemoglobin: 10.9 g/dL — ABNORMAL LOW (ref 12.0–15.0)
MCH: 30.4 pg (ref 26.0–34.0)
MCHC: 34.4 g/dL (ref 30.0–36.0)
MCV: 88.5 fL (ref 80.0–100.0)
Platelets: 231 10*3/uL (ref 150–400)
RBC: 3.58 MIL/uL — ABNORMAL LOW (ref 3.87–5.11)
RDW: 12.9 % (ref 11.5–15.5)
WBC: 7.8 10*3/uL (ref 4.0–10.5)
nRBC: 0 % (ref 0.0–0.2)

## 2020-01-03 MED ORDER — ONDANSETRON HCL 4 MG/2ML IJ SOLN
4.0000 mg | Freq: Once | INTRAMUSCULAR | Status: AC
  Administered 2020-01-03: 4 mg via INTRAVENOUS
  Filled 2020-01-03: qty 2

## 2020-01-03 MED ORDER — ONDANSETRON 4 MG PO TBDP: 4.0000 mg | ORAL_TABLET | Freq: Three times a day (TID) | ORAL | 0 refills | Status: DC | PRN

## 2020-01-03 MED ORDER — SODIUM CHLORIDE 0.9 % IV BOLUS
1000.0000 mL | Freq: Once | INTRAVENOUS | Status: AC
  Administered 2020-01-03: 11:00:00 1000 mL via INTRAVENOUS

## 2020-01-03 NOTE — ED Triage Notes (Signed)
Says dx covid on jan 25th.  Is pregnant.  Says she has not been eating or drinking enough. Went back to work and felt faint and almost passed out today.  She is alert and in nad.  Skin warm and dry now.

## 2020-01-03 NOTE — ED Provider Notes (Signed)
[facility] Emergency Department Provider Note   ____________________________________________   First MD Initiated Contact with Patient 01/03/20 1010     (approximate)  I have reviewed the triage vital signs and the nursing notes.   HISTORY  Chief Complaint Weakness    HPI Morgan Young is a 24 y.o. female, G1P0 at approximately 22 weeks of pregnancy, with past medical history of anxiety who presents to the ED complaining of lightheadedness and near syncope.  Patient reports that she had just returned to work today after being out for COVID-19.  She states she tested positive on January 25 and had some cough, chest pain, and shortness of breath then that has since resolved.  She does state that she has ongoing poor appetite and has not been eating or drinking as much as she should.  She complains of some nausea, but denies any vomiting or diarrhea.  She denies any recent fevers or cough.  She did feel very lightheaded when she went back to work on an [employer] today, felt like she might pass out and decided to get evaluated in the ED.  She denies any ongoing lightheadedness, but does complain of some nausea, denies abdominal pain, dysuria, or hematuria.        Past Medical History:  Diagnosis Date  . Anxiety   . History of self-harm   . History of self-harm    cutting-last 2011  . History of urinary tract infection   . Scoliosis     Patient Active Problem List   Diagnosis Date Noted  . Encounter for supervision of normal first pregnancy in second trimester 10/17/2019  . H/O self-harm 07/16/2017    Past Surgical History:  Procedure Laterality Date  . Denies surgical hx      Prior to Admission medications   Medication Sig Start Date End Date Taking? Authorizing Provider  ondansetron (ZOFRAN ODT) 4 MG disintegrating tablet Take 1 tablet (4 mg total) by mouth every 8 (eight) hours as needed for nausea or vomiting. 01/03/20   Chesley Noon, MD    Prenatal Vit-Fe Fumarate-FA (PRENATAL VITAMIN) 27-0.8 MG TABS Take 1 tablet by mouth daily at 6 (six) AM. 09/19/19   Federico Flake, MD    Allergies Patient has no known allergies.  Family History  Problem Relation Age of Onset  . Asthma Mother   . Hypertension Mother   . Diabetes Mother   . Hyperlipidemia Mother   . Other Father        unknown medical history  . Hypertension Maternal Grandmother   . Hyperlipidemia Maternal Grandmother   . Diabetes Sister   . Cancer Maternal Grandfather     Social History Social History   Tobacco Use  . Smoking status: Never Smoker  . Smokeless tobacco: Never Used  Substance Use Topics  . Alcohol use: Not Currently    Comment: Last ETOH use 3 - 4 weeks ago  . Drug use: Not Currently    Types: Marijuana    Comment: Last use January or February 2020.    Review of Systems  Constitutional: No fever/chills Eyes: No visual changes. ENT: No sore throat. Cardiovascular: Denies chest pain.  Positive for lightheadedness and near syncope. Respiratory: Denies shortness of breath. Gastrointestinal: No abdominal pain.  Positive for nausea, no vomiting.  No diarrhea.  No constipation. Genitourinary: Negative for dysuria. Musculoskeletal: Negative for back pain. Skin: Negative for rash. Neurological: Negative for headaches, focal weakness or numbness.  ____________________________________________   PHYSICAL  EXAM:  VITAL SIGNS: ED Triage Vitals  Enc Vitals Group     BP --      Pulse --      Resp --      Temp --      Temp src --      SpO2 --      Weight 01/03/20 0953 195 lb 15.8 oz (88.9 kg)     Height --      Head Circumference --      Peak Flow --      Pain Score 01/03/20 0952 0     Pain Loc --      Pain Edu? --      Excl. in [place]? --     Constitutional: Alert and oriented. Eyes: Conjunctivae are normal. Head: Atraumatic. Nose: No congestion/rhinnorhea. Mouth/Throat: Mucous membranes are moist. Neck: Normal  ROM Cardiovascular: Normal rate, regular rhythm. Grossly normal heart sounds. Respiratory: Normal respiratory effort.  No retractions. Lungs CTAB. Gastrointestinal: Gravid abdomen, soft and nontender. No distention. Genitourinary: deferred Musculoskeletal: No lower extremity tenderness nor edema. Neurologic:  Normal speech and language. No gross focal neurologic deficits are appreciated. Skin:  Skin is warm, dry and intact. No rash noted. Psychiatric: Mood and affect are normal. Speech and behavior are normal.  ____________________________________________   LABS (all labs ordered are listed, but only abnormal results are displayed)  Labs Reviewed  BASIC METABOLIC PANEL - Abnormal; Notable for the following components:      Result Value   Sodium 134 (*)    CO2 21 (*)    Creatinine, Ser 0.39 (*)    All other components within normal limits  CBC - Abnormal; Notable for the following components:   RBC 3.58 (*)    Hemoglobin 10.9 (*)    HCT 31.7 (*)    All other components within normal limits  URINALYSIS, COMPLETE (UACMP) WITH MICROSCOPIC   ____________________________________________  EKG  ED ECG REPORT I, Blake Divine, the attending physician, personally viewed and interpreted this ECG.   Date: 01/03/2020  EKG Time: 10:02  Rate: 85  Rhythm: normal sinus rhythm  Axis: Normal  Intervals:none  ST&T Change: Nonspecific T wave changes   PROCEDURES  Procedure(s) performed (including Critical Care):  Procedures   ____________________________________________   INITIAL IMPRESSION / ASSESSMENT AND PLAN / ED COURSE       24 year old female, G1 P0 at approximately 22 weeks of pregnancy, presents to the ED for lightheadedness and near syncopal episode today after returning to work.  She was diagnosed with COVID-19 on January 25, but states her respiratory symptoms have resolved since then.  I doubt her current episode is related to COVID-19 given the length of time and  resolution of her other symptoms.  She has had some nausea and poor p.o. intake recently, likely related to her pregnancy.  She had a ultrasound earlier this pregnancy showing normal intrauterine gestation.  EKG without evidence of arrhythmia, will hydrate with IV fluids and treat with Zofran.  She will be appropriate for further outpatient work-up if lab work unremarkable and she is able to tolerate p.o.  Lab work is unremarkable, patient feeling better following IV fluid bolus and dose of Zofran, able to tolerate p.o. here in the ED.  She has been unable to provide a urine sample but denies any urinary symptoms and wishes to be discharged at this time.  I have counseled her to follow-up with her OB/GYN and otherwise return to the ED for new worsening symptoms, patient  agrees with plan.      ____________________________________________   FINAL CLINICAL IMPRESSION(S) / ED DIAGNOSES  Final diagnoses:  Near syncope  Lightheadedness  [redacted] weeks gestation of pregnancy     ED Discharge Orders         Ordered    ondansetron (ZOFRAN ODT) 4 MG disintegrating tablet  Every 8 hours PRN     01/03/20 1321           Note:  This document was prepared using Dragon voice recognition software and may include unintentional dictation errors.   Chesley Noon, MD 01/03/20 [phone]

## 2020-01-19 DIAGNOSIS — O98512 Other viral diseases complicating pregnancy, second trimester: Secondary | ICD-10-CM | POA: Insufficient documentation

## 2020-01-29 ENCOUNTER — Other Ambulatory Visit: Payer: Self-pay

## 2020-01-29 ENCOUNTER — Emergency Department
Admission: EM | Admit: 2020-01-29 | Discharge: 2020-01-30 | Disposition: A | Discharge status: Home / Self Care | Payer: Medicaid Other | Attending: Emergency Medicine | Admitting: Emergency Medicine

## 2020-01-29 ENCOUNTER — Encounter: Payer: Self-pay | Admitting: *Deleted

## 2020-01-29 ENCOUNTER — Emergency Department: Payer: Medicaid Other

## 2020-01-29 DIAGNOSIS — R1011 Right upper quadrant pain: Secondary | ICD-10-CM

## 2020-01-29 DIAGNOSIS — Z3A25 25 weeks gestation of pregnancy: Secondary | ICD-10-CM | POA: Diagnosis not present

## 2020-01-29 DIAGNOSIS — R197 Diarrhea, unspecified: Secondary | ICD-10-CM | POA: Insufficient documentation

## 2020-01-29 DIAGNOSIS — O26892 Other specified pregnancy related conditions, second trimester: Secondary | ICD-10-CM | POA: Insufficient documentation

## 2020-01-29 DIAGNOSIS — Z79899 Other long term (current) drug therapy: Secondary | ICD-10-CM | POA: Insufficient documentation

## 2020-01-29 DIAGNOSIS — O26899 Other specified pregnancy related conditions, unspecified trimester: Secondary | ICD-10-CM

## 2020-01-29 DIAGNOSIS — R112 Nausea with vomiting, unspecified: Secondary | ICD-10-CM

## 2020-01-29 DIAGNOSIS — O219 Vomiting of pregnancy, unspecified: Secondary | ICD-10-CM | POA: Diagnosis not present

## 2020-01-29 LAB — CBC
HCT: 33.5 % — ABNORMAL LOW (ref 36.0–46.0)
Hemoglobin: 11.6 g/dL — ABNORMAL LOW (ref 12.0–15.0)
MCH: 30.6 pg (ref 26.0–34.0)
MCHC: 34.6 g/dL (ref 30.0–36.0)
MCV: 88.4 fL (ref 80.0–100.0)
Platelets: 228 10*3/uL (ref 150–400)
RBC: 3.79 MIL/uL — ABNORMAL LOW (ref 3.87–5.11)
RDW: 12.4 % (ref 11.5–15.5)
WBC: 7.8 10*3/uL (ref 4.0–10.5)
nRBC: 0 % (ref 0.0–0.2)

## 2020-01-29 LAB — COMPREHENSIVE METABOLIC PANEL
ALT: 15 U/L (ref 0–44)
AST: 21 U/L (ref 15–41)
Albumin: 3.6 g/dL (ref 3.5–5.0)
Alkaline Phosphatase: 62 U/L (ref 38–126)
Anion gap: 7 (ref 5–15)
BUN: 8 mg/dL (ref 6–20)
CO2: 21 mmol/L — ABNORMAL LOW (ref 22–32)
Calcium: 8.5 mg/dL — ABNORMAL LOW (ref 8.9–10.3)
Chloride: 103 mmol/L (ref 98–111)
Creatinine, Ser: 0.47 mg/dL (ref 0.44–1.00)
GFR calc Af Amer: >60 mL/min (ref >60)
GFR calc non Af Amer: >60 mL/min (ref >60)
Glucose, Bld: 112 mg/dL — ABNORMAL HIGH (ref 70–99)
Potassium: 2.8 mmol/L — ABNORMAL LOW (ref 3.5–5.1)
Sodium: 131 mmol/L — ABNORMAL LOW (ref 135–145)
Total Bilirubin: 0.6 mg/dL (ref 0.3–1.2)
Total Protein: 7.5 g/dL (ref 6.5–8.1)

## 2020-01-29 LAB — HCG, QUANTITATIVE, PREGNANCY: hCG, Beta Chain, Quant, S: 20000 m[IU]/mL — ABNORMAL HIGH (ref <5)

## 2020-01-29 LAB — LIPASE, BLOOD: Lipase: 19 U/L (ref 11–51)

## 2020-01-29 MED ORDER — SODIUM CHLORIDE 0.9% FLUSH
3.0000 mL | Freq: Once | INTRAVENOUS | Status: AC
  Administered 2020-01-29: 3 mL via INTRAVENOUS

## 2020-01-29 MED ORDER — SODIUM CHLORIDE 0.9 % IV BOLUS
1000.0000 mL | Freq: Once | INTRAVENOUS | Status: AC
  Administered 2020-01-29: 1000 mL via INTRAVENOUS

## 2020-01-29 NOTE — ED Notes (Signed)
Pt unable to provide urinae sample at this time but has specimen cup and is aware sample is needed. Pt updated on sub wait and provided a call bell if assistance is needed.Warm blanket provided. Pt calm and resting in recliner at this time.

## 2020-01-29 NOTE — ED Triage Notes (Signed)
Pt to ED reporting NVD starting last night. Pt is [redacted] weeks pregnant and reports a similar event last Monday that improved. No changes in urine and no complications with pregnancy to this point. No vaginal bleeding or decreased fetal movement. 8/10 generalized sharp abd pain. OBGYN reportedly prescribed Zofran that is not helping with vomiting. Pt reports 20 + episodes of vomiting today.

## 2020-01-29 NOTE — ED Provider Notes (Signed)
[facility] Emergency Department Provider Note  ____________________________________________   First MD Initiated Contact with Patient 01/29/20 2309     (approximate)  I have reviewed the triage vital signs and the nursing notes.   HISTORY  Chief Complaint Emesis and Diarrhea    HPI Morgan Young is a 24 y.o. female G1, P0  [redacted] weeks pregnant with below list of previous medical conditions presents to the emergency department secondary to nausea vomiting and diarrhea which patient states began at midnight this morning..  Patient states that current pain score is 4 out of 10.  Patient states that she has not had any additional episodes of diarrhea since she has been in the emergency department.  Patient also states that nausea has improved and vomiting has resolved.  Patient states that she had a similar episode last week followed by resolution.  Patient states that she did take Zofran as prescribed at home without improvement before arrival.  Patient denies any vaginal bleeding.  Patient does admit to fetal movement today.    Past Medical History:  Diagnosis Date  . Anxiety   . History of self-harm   . History of self-harm    cutting-last 2011  . History of urinary tract infection   . Scoliosis     Patient Active Problem List   Diagnosis Date Noted  . Encounter for supervision of normal first pregnancy in second trimester 10/17/2019  . H/O self-harm 07/16/2017    Past Surgical History:  Procedure Laterality Date  . Denies surgical hx      Prior to Admission medications   Medication Sig Start Date End Date Taking? Authorizing Provider  ondansetron (ZOFRAN ODT) 4 MG disintegrating tablet Take 1 tablet (4 mg total) by mouth every 8 (eight) hours as needed for nausea or vomiting. 01/03/20   Blake Divine, MD  Prenatal Vit-Fe Fumarate-FA (PRENATAL VITAMIN) 27-0.8 MG TABS Take 1 tablet by mouth daily at 6 (six) AM. 09/19/19   Caren Macadam, MD     Allergies Peanut-containing drug products  Family History  Problem Relation Age of Onset  . Asthma Mother   . Hypertension Mother   . Diabetes Mother   . Hyperlipidemia Mother   . Other Father        unknown medical history  . Hypertension Maternal Grandmother   . Hyperlipidemia Maternal Grandmother   . Diabetes Sister   . Cancer Maternal Grandfather     Social History Social History   Tobacco Use  . Smoking status: Never Smoker  . Smokeless tobacco: Never Used  Substance Use Topics  . Alcohol use: Not Currently    Comment: Last ETOH use 3 - 4 weeks ago  . Drug use: Not Currently    Types: Marijuana    Comment: Last use January or February 2020.    Review of Systems Constitutional: No fever/chills Eyes: No visual changes. ENT: No sore throat. Cardiovascular: Denies chest pain. Respiratory: Denies shortness of breath. Gastrointestinal: Positive for abdominal pain nausea vomiting diarrhea. Genitourinary: Negative for dysuria. Musculoskeletal: Negative for neck pain.  Negative for back pain. Integumentary: Negative for rash. Neurological: Negative for headaches, focal weakness or numbness.  ____________________________________________   PHYSICAL EXAM:  VITAL SIGNS: ED Triage Vitals  Enc Vitals Group     BP 01/29/20 1911 138/68     Pulse Rate 01/29/20 1911 (!) 120     Resp 01/29/20 1911 16     Temp 01/29/20 1911 99.8 F (37.7 C)  Temp Source 01/29/20 1911 Oral     SpO2 01/29/20 1911 99 %     Weight 01/29/20 1912 88 kg (194 lb)     Height 01/29/20 1912 1.676 m (5\' 6" )     Head Circumference --      Peak Flow --      Pain Score 01/29/20 1912 8     Pain Loc --      Pain Edu? --      Excl. in [place]? --     Constitutional: Alert and oriented.  Eyes: Conjunctivae are normal.  Mouth/Throat: Patient is wearing a mask. Neck: No stridor.  No meningeal signs.   Cardiovascular: Normal rate, regular rhythm. Good peripheral circulation. Grossly normal heart  sounds. Respiratory: Normal respiratory effort.  No retractions. Gastrointestinal: Soft and nontender. No distention.  Musculoskeletal: No lower extremity tenderness nor edema. No gross deformities of extremities. Neurologic:  Normal speech and language. No gross focal neurologic deficits are appreciated.  Skin:  Skin is warm, dry and intact. Psychiatric: Mood and affect are normal. Speech and behavior are normal.  ____________________________________________   LABS (all labs ordered are listed, but only abnormal results are displayed)  Labs Reviewed  COMPREHENSIVE METABOLIC PANEL - Abnormal; Notable for the following components:      Result Value   Sodium 131 (*)    Potassium 2.8 (*)    CO2 21 (*)    Glucose, Bld 112 (*)    Calcium 8.5 (*)    All other components within normal limits  CBC - Abnormal; Notable for the following components:   RBC 3.79 (*)    Hemoglobin 11.6 (*)    HCT 33.5 (*)    All other components within normal limits  HCG, QUANTITATIVE, PREGNANCY - Abnormal; Notable for the following components:   hCG, Beta Chain, Quant, S 20,000 (*)    All other components within normal limits  LIPASE, BLOOD  URINALYSIS, COMPLETE (UACMP) WITH MICROSCOPIC   _______  RADIOLOGY I, Palmer N , personally viewed and evaluated these images (plain radiographs) as part of my medical decision making, as well as reviewing the written report by the radiologist.  ED MD interpretation: OB ultrasound revealed 25-week 2-day single live intrauterine pregnancy without any significant abnormality per radiologist  Right upper quadrant ultrasound negative  Official radiology report(s): 03/30/20 OB Limited  Result Date: 01/30/2020 CLINICAL DATA:  Abdominal pain. EXAM: LIMITED OBSTETRIC ULTRASOUND FINDINGS: Number of Fetuses: 1 Heart Rate:  145 bpm Movement: Yes Presentation: Transverse Placental Location: Anterior.  A placental [place] is noted. Previa: No Amniotic Fluid (Subjective):  Within  normal limits. BPD: 6.25 cm 25 w  2 d MATERNAL FINDINGS: Cervix:  Appears closed. Uterus/Adnexae: No abnormality visualized. IMPRESSION: Single live IUP at 25 weeks and 2 days as detailed above. No significant abnormality detected. This exam is performed on an emergent basis and does not comprehensively evaluate fetal size, dating, or anatomy; follow-up complete OB 03/31/2020 should be considered if further fetal assessment is warranted. Electronically Signed   By: [place] M.D.   On: 01/30/2020 01:02   03/31/2020 ABDOMEN LIMITED RUQ  Result Date: 01/30/2020 CLINICAL DATA:  Right upper quadrant abdominal pain EXAM: ULTRASOUND ABDOMEN LIMITED RIGHT UPPER QUADRANT COMPARISON:  None. FINDINGS: Gallbladder: No gallstones or wall thickening visualized. No sonographic Murphy sign noted by sonographer. Common bile duct: Diameter: 3 mm Liver: No focal lesion identified. Within normal limits in parenchymal echogenicity. Portal vein is patent on color Doppler imaging with normal direction of blood  flow towards the liver. Other: None. IMPRESSION: Normal study. Electronically Signed   By: Katherine Mantle M.D.   On: 01/30/2020 01:00      Procedures   ____________________________________________   INITIAL IMPRESSION / MDM / ASSESSMENT AND PLAN / ED COURSE  As part of my medical decision making, I reviewed the following data within the electronic MEDICAL RECORD NUMBER  24 year old female presented with above-stated history and physical exam secondary to nausea vomiting diarrhea and reported abdominal discomfort.  Patient received 2 L IV normal saline.  Plan to obtain stool sample however patient had no further bowel movements while in the emergency department during entire period of observation.  Patient also had no vomiting during that period of time either.  Patient states that she feels much better at this time and is requesting discharge home.     ____________________________________________  FINAL CLINICAL  IMPRESSION(S) / ED DIAGNOSES  Final diagnoses:  RUQ pain  Nausea vomiting and diarrhea     MEDICATIONS GIVEN DURING THIS VISIT:  Medications  sodium chloride flush (NS) 0.9 % injection 3 mL (3 mLs Intravenous Given 01/29/20 1934)  sodium chloride 0.9 % bolus 1,000 mL (0 mLs Intravenous Stopped 01/29/20 2114)  sodium chloride 0.9 % bolus 1,000 mL (0 mLs Intravenous Stopped 01/30/20 0229)     ED Discharge Orders    None      *Please note:  DONNARAE RAE was evaluated in Emergency Department on 01/30/2020 for the symptoms described in the history of present illness. She was evaluated in the context of the global COVID-19 pandemic, which necessitated consideration that the patient might be at risk for infection with the SARS-CoV-2 virus that causes COVID-19. Institutional protocols and algorithms that pertain to the evaluation of patients at risk for COVID-19 are in a state of rapid change based on information released by regulatory bodies including the CDC and federal and state organizations. These policies and algorithms were followed during the patient's care in the ED.  Some ED evaluations and interventions may be delayed as a result of limited staffing during the pandemic.*  Note:  This document was prepared using Dragon voice recognition software and may include unintentional dictation errors.   Darci Current, MD 01/30/20 [phone]

## 2020-01-29 NOTE — ED Notes (Signed)
Fluids almost complete. IV readjusted per pt request for comfort. Pt denies having to use bathroom at this time. Emesis bag at bedside.

## 2020-01-30 MED ORDER — POTASSIUM CHLORIDE CRYS ER 20 MEQ PO TBCR: 40.0000 meq | EXTENDED_RELEASE_TABLET | Freq: Once | ORAL | Status: DC

## 2020-01-30 NOTE — ED Notes (Signed)
Reviewed discharge instructions and follow-up care with patient. Patient verbalized understanding of all information reviewed. Patient stable, with no distress noted at this time.    

## 2020-04-16 DIAGNOSIS — O365930 Maternal care for other known or suspected poor fetal growth, third trimester, not applicable or unspecified: Secondary | ICD-10-CM | POA: Insufficient documentation

## 2020-05-30 ENCOUNTER — Ambulatory Visit: Payer: Medicaid Other

## 2020-05-31 ENCOUNTER — Other Ambulatory Visit: Payer: Self-pay

## 2020-05-31 ENCOUNTER — Encounter: Payer: Self-pay | Admitting: Physician Assistant

## 2020-05-31 ENCOUNTER — Ambulatory Visit: Payer: Medicaid Other | Admitting: Physician Assistant

## 2020-05-31 VITALS — BP 101/67 | Ht 66.5 in | Wt 191.4 lb

## 2020-05-31 DIAGNOSIS — Z1331 Encounter for screening for depression: Secondary | ICD-10-CM | POA: Insufficient documentation

## 2020-05-31 DIAGNOSIS — Z30013 Encounter for initial prescription of injectable contraceptive: Secondary | ICD-10-CM

## 2020-05-31 DIAGNOSIS — Z3009 Encounter for other general counseling and advice on contraception: Secondary | ICD-10-CM

## 2020-05-31 LAB — HEMOGLOBIN, FINGERSTICK: Hemoglobin: 13.4 g/dL (ref 11.1–15.9)

## 2020-05-31 MED ORDER — MEDROXYPROGESTERONE ACETATE 150 MG/ML IM SUSP
150.0000 mg | Freq: Once | INTRAMUSCULAR | Status: AC
  Administered 2020-05-31: 150 mg via INTRAMUSCULAR

## 2020-05-31 NOTE — Progress Notes (Signed)
Post Partum Exam  Morgan Young is a 24 y.o. G91P0101 female who presents for a postpartum visit. She is 6 weeks postpartum following a IOL at 37 wk fue to severe IUGR with SVD at 37 1/7.. I have fully reviewed the prenatal and intrapartum course. The delivery was at 37 1/7 gestational weeks.  Anesthesia: epidural, IV sedation and nitrous oxide. Postpartum course has been uneventful. Baby's course has been uneventful. Baby is feeding by Bottle and Breast Bleeding no bleeding. Bowel function is normal. Bladder function is normal. Patient is not sexually active. Contraception method is abstinence.   Postpartum depression screening:  Edinburgh Postnatal Depression Scale - 05/31/20 1406      Edinburgh Postnatal Depression Scale:  In the Past 7 Days   I have been able to laugh and see the funny side of things. 1    I have looked forward with enjoyment to things. 2    I have blamed myself unnecessarily when things went wrong. 3    I have been anxious or worried for no good reason. 2    I have felt scared or panicky for no good reason. 0    Things have been getting on top of me. 2    I have been so unhappy that I have had difficulty sleeping. 2    I have felt sad or miserable. 1    I have been so unhappy that I have been crying. 2    The thought of harming myself has occurred to me. 0    Edinburgh Postnatal Depression Scale Total 15            The following portions of the patient's history were reviewed and updated as appropriate: allergies, current medications, past family history, past medical history, past social history, past surgical history and problem list. Last pap smear done 07/17/17 and was Normal  Review of Systems A comprehensive review of systems was negative except for: Behavioral/Psych: positive for unexpected tearfulness since day after delivery. No SI/HI, feels able to care for infant, has help from FOB and her mom.    Objective:  BP 101/67   Ht 5' 6.5" (1.689 m)   Wt  191 lb 6.4 oz (86.8 kg)   LMP 05/21/2020 (Approximate) Comment: normal  Breastfeeding Yes Comment: breast and formula  BMI 30.43 kg/m   Gen: well appearing, NAD HEENT: no scleral icterus CV: RR Lung: Normal WOB Breast:performed-yes Lactating, no erythema, warmth, skin changes or dominant mass or axillary, supraclavicular adenopathy. Ext: warm well perfused  GU: performed Rectal: no external hemorrhoids -  not indicated       Assessment:    Normal postpartum exam, except for mood/abnormal depression screen. Pap smear offered at today's visit, pt declined and prefers to do at next visit (not quite 3 years since last Pap.)  Plan:   Essential components of care per ACOG recommendations for Comprehensive Postpartum exam:  1.  Mood and well being: Patient with positive depression screening today. Reviewed local resources for support. EPDS is high risk. Reviewed resources and that mood sx in first year after pregnancy are considered related to pregnancy and to reach out for help at [facility] if needed. Discussed [facility] as link to care and availability of LCSW for counseling. Referral to LCSW done at pt request. - Patient does not use tobacco.  - hx of drug use? No    2. Infant care and feeding:  -Patient currently breastmilk feeding? Yes If breastmilk feeding discussed return to  work and pumping. If needed, patient was provided letter for work to allow for every 2-3 hr pumping breaks, and to be granted a private location to express breastmilk and refrigerated area to store breastmilk. Letter not needed due to plan to work from home. Reviewed importance of draining breast regularly to support lactation. I  -Recommended patient engage with WIC/BFpeer counselors  -Counseled to sign new child up for [facility] services -Social determinants of health (SDOH) reviewed in EPIC. No concerns in light of good support and openness to counseling related to abnormal depression screen.   3. Sexuality, contraception  and birth spacing  Contraception: Contraception counseling: Reviewed all forms of birth control options in the tiered based approach. available including abstinence; over the counter/barrier methods; hormonal contraceptive medication including pill, patch, ring, injection,contraceptive implant; hormonal and nonhormonal IUDs; permanent sterilization options including vasectomy and the various tubal sterilization modalities. Risks, benefits, and typical effectiveness rates were reviewed.  Questions were answered.  Written information was also given to the patient to review.  Patient desires DMPA, this was prescribed for patient. She will follow up in  11-13 weeks for surveillance and routine Pap.  She was told to call with any further questions, or with any concerns about this method of contraception.  Emphasized use of condoms 100% of the time for STI prevention.   - Patient does not want a pregnancy in the next year.  Desired family size is unsure - maybe one more child.  - Reviewed forms of contraception in tiered fashion. Patient desired Depo-Provera today.   - Discussed birth spacing of 18 months  4. Sleep and fatigue -Encouraged family/partner/community support of 4 hrs of uninterrupted sleep to help with mood and fatigue  5. Physical Recovery  - Discussed patients delivery and complications - Patient had no laceration, perineal healing reviewed. Patient expressed understanding - Patient has urinary incontinence? No - Patient is safe to resume physical and sexual activity  6.  Health Maintenance/Chronic Disease - Last pap smear performed 06/2017 and was normal Hgb today.  Patient given handout about PCP care in the community Given MVI per family planning program guidelines and availability  Follow up in: 12 weeks or as needed.

## 2020-05-31 NOTE — Progress Notes (Signed)
Pt to clinic for post partum exam; wants birth control, unsure of which BC.

## 2020-05-31 NOTE — Progress Notes (Signed)
Hgb reviewed, no tx per standing order. DMPA 150 mg IM administered today per Landry Dyke, PA verbal order. Pt counseled to continue to abstain for 2 weeks with depo start today, but if sexually active within next 2 weeks pt to use back-up method like condoms. Pt accepted condoms. Provider orders completed.

## 2020-07-23 ENCOUNTER — Telehealth: Payer: Self-pay

## 2020-07-23 NOTE — Telephone Encounter (Signed)
Telephone call to patient today regarding the need for a repeat PAP due 06/2020 and Physical.  Patient phone is not answering calls today.  PAP letter mailed today. Hart Carwin, RN

## 2020-08-17 ENCOUNTER — Telehealth: Payer: Self-pay

## 2020-08-17 ENCOUNTER — Ambulatory Visit (LOCAL_COMMUNITY_HEALTH_CENTER): Payer: Medicaid Other

## 2020-08-17 ENCOUNTER — Other Ambulatory Visit: Payer: Self-pay

## 2020-08-17 DIAGNOSIS — Z719 Counseling, unspecified: Secondary | ICD-10-CM

## 2020-08-17 NOTE — Telephone Encounter (Signed)
Phone call to pt x3.  Received automated message that number is not accepting calls at this time, please try your call again later.  RN trying to call pt about appt scheduled for 08/17/20 in RN clinic. At last visit at [facility], 05/31/20, pt was instructed to get PAP with no orders for additional depo. Pt needs to be scheduled in provider clinic.

## 2020-08-17 NOTE — Progress Notes (Signed)
Pt to RN clinic requesting depo. Pt is 11.1 weeks post depo today. Pt counseled that during last visit on 05/31/20, provider had written instructions for pt to get PAP when depo was due again. Consulted with provider. Per verbal instruction by Sadie Haber, PA, pt was counseled that provider desires for pt to RTC for PAP and depo in provider clinic; if not possible, can administer depo today, but must have PAP before next depo.  Pt states she can come for provider visit and have pap and depo done; per pt preference, scheduled for [facility] provider appt on 08/27/20. Pt counseled that RN tried to reach pt by phone but unable to connect with her. Pt states she has new number and it was changed by clerical today.

## 2020-08-27 ENCOUNTER — Ambulatory Visit (LOCAL_COMMUNITY_HEALTH_CENTER): Payer: Medicaid Other | Admitting: Physician Assistant

## 2020-08-27 ENCOUNTER — Other Ambulatory Visit: Payer: Self-pay

## 2020-08-27 VITALS — BP 118/73 | Ht 66.0 in | Wt 198.6 lb

## 2020-08-27 DIAGNOSIS — Z30013 Encounter for initial prescription of injectable contraceptive: Secondary | ICD-10-CM | POA: Diagnosis not present

## 2020-08-27 DIAGNOSIS — Z124 Encounter for screening for malignant neoplasm of cervix: Secondary | ICD-10-CM | POA: Diagnosis not present

## 2020-08-27 DIAGNOSIS — Z3009 Encounter for other general counseling and advice on contraception: Secondary | ICD-10-CM | POA: Diagnosis not present

## 2020-08-27 DIAGNOSIS — Z3042 Encounter for surveillance of injectable contraceptive: Secondary | ICD-10-CM

## 2020-08-27 MED ORDER — MEDROXYPROGESTERONE ACETATE 150 MG/ML IM SUSP
150.0000 mg | INTRAMUSCULAR | Status: AC
  Administered 2020-08-27 – 2021-02-27 (×3): 150 mg via INTRAMUSCULAR

## 2020-08-27 NOTE — Progress Notes (Signed)
Patient received physical at last visit and depo. Here today for PAP and depo.   Harvie Heck, RN   Post:  RN admin Depo 150mg  IM in left arm. Patient tolerated well. Re,inder card given for next depo.   , RN

## 2020-08-28 ENCOUNTER — Encounter: Payer: Self-pay | Admitting: Physician Assistant

## 2020-08-28 NOTE — Progress Notes (Signed)
[facility]  Subjective:  Morgan Young is a 24 y.o. being seen today for a pap and Depo.  Chief Complaint  Patient presents with  . Contraception    depo and pap    HPI  Patient into clinic for pap and depo.  States that she is doing well with the Depo and wants to continue with this as her BCM.  Reports that she is still having back and rib pain.  Reports that she was having this during her pregnancy but it has continued.  Reports a history of scoliosis.  Per chart review, PP exam done 05/2020.  Does the patient have a current or past history of drug use? No   No components found for: HCV]   Health Maintenance Due  Topic Date Due  . Hepatitis C Screening  Never done  . COVID-19 Vaccine (1) Never done  . INFLUENZA VACCINE  Never done  . PAP-Cervical Cytology Screening  07/17/2020  . PAP SMEAR-Modifier  07/17/2020    Review of Systems  All other systems reviewed and are negative.   The following portions of the patient's history were reviewed and updated as appropriate: allergies, current medications, past family history, past medical history, past social history, past surgical history and problem list. Problem list updated.   See flowsheet for other program required questions.  Objective:   Vitals:   08/27/20 0902  BP: 118/73  Weight: 198 lb 9.6 oz (90.1 kg)  Height: 5\' 6"  (1.676 m)    Physical Exam Vitals and nursing note reviewed.  Constitutional:      General: She is not in acute distress.    Appearance: Normal appearance.  HENT:     Head: Normocephalic and atraumatic.  Eyes:     Conjunctiva/sclera: Conjunctivae normal.  Pulmonary:     Effort: Pulmonary effort is normal.  Abdominal:     Palpations: Abdomen is soft. There is no mass.     Tenderness: There is no abdominal tenderness. There is no guarding or rebound.  Genitourinary:    General: Normal vulva.     Rectum: Normal.      Comments: External genitalia/pubic area without nits, lice, edema, erythema, lesions and inguinal adenopathy. Vagina with normal mucosa and discharge. Cervix without visible lesions. Uterus firm, mobile, nt, no masses, no CMT, no adnexal tenderness or fullness. Skin:    General: Skin is warm and dry.     Findings: No bruising, erythema, lesion or rash.  Neurological:     Mental Status: She is alert and oriented to person, place, and time.  Psychiatric:        Mood and Affect: Mood normal.        Behavior: Behavior normal.        Thought Content: Thought content normal.        Judgment: Judgment normal.       Assessment and Plan:  Morgan Young is a 24 y.o. female presenting to the [facility] Department for a Women's Health problem visit  1. Encounter for counseling regarding contraception Reviewed with patient normal SE of Depo and when to call clinic for irregular bleeding. Enc condoms with all sex for STD protection. Enc patient to follow up with PCP re: back and rib pain for further evaluation.  2. Pap smear for cervical cancer screening Await test results.  Counseled that RN will call or send a letter once results are back with follow up  plan. Patient without vaginal symptoms and declines GC/Chlamydia testing today.  - Pap IG (Image Guided)  3. Surveillance for Depo-Provera contraception Continue with Depo 150 mg IM q 11-13 weeks until RP is due in 05/2021. - medroxyPROGESTERone (DEPO-PROVERA) injection 150 mg     Return in about 11 weeks (around 11/12/2020) for depo and prn.  No future appointments.  Matt Holmes, PA

## 2020-08-30 LAB — PAP IG (IMAGE GUIDED): PAP Smear Comment: 0

## 2020-11-21 ENCOUNTER — Ambulatory Visit (LOCAL_COMMUNITY_HEALTH_CENTER): Payer: Medicaid Other

## 2020-11-21 ENCOUNTER — Other Ambulatory Visit: Payer: Self-pay

## 2020-11-21 ENCOUNTER — Ambulatory Visit: Payer: Medicaid Other

## 2020-11-21 VITALS — BP 125/67 | Ht 66.0 in | Wt 195.0 lb

## 2020-11-21 DIAGNOSIS — Z30013 Encounter for initial prescription of injectable contraceptive: Secondary | ICD-10-CM | POA: Diagnosis not present

## 2020-11-21 DIAGNOSIS — Z3009 Encounter for other general counseling and advice on contraception: Secondary | ICD-10-CM | POA: Diagnosis not present

## 2020-11-21 DIAGNOSIS — Z3042 Encounter for surveillance of injectable contraceptive: Secondary | ICD-10-CM

## 2020-11-21 NOTE — Progress Notes (Signed)
12 weeks 2 days post depo. Voices no problems.  DMPA 150mg  IM given R Deltoid per order by C. [place], [place]ANG[place] dated 08/27/2020. Tolerated well. Next depo due 02/06/2021, pt aware. 02/08/2021, RN

## 2021-02-27 ENCOUNTER — Other Ambulatory Visit: Payer: Self-pay

## 2021-02-27 ENCOUNTER — Ambulatory Visit (LOCAL_COMMUNITY_HEALTH_CENTER): Payer: Medicaid Other

## 2021-02-27 VITALS — BP 117/62 | Ht 66.0 in | Wt 193.5 lb

## 2021-02-27 DIAGNOSIS — Z3009 Encounter for other general counseling and advice on contraception: Secondary | ICD-10-CM | POA: Diagnosis not present

## 2021-02-27 DIAGNOSIS — Z3042 Encounter for surveillance of injectable contraceptive: Secondary | ICD-10-CM | POA: Diagnosis not present

## 2021-02-27 NOTE — Progress Notes (Signed)
14 weeks post depo. Voices no concerns. RN counseled about adhering to 11-13 week intervals for depo for optimal benefit. Pt in agreement. Depo given today per order by C. [place], [place] dated 08/27/2020. Tolerated well L delt. Next depo due 05/15/2021, pt aware. Jerel Shepherd, RN

## 2021-05-15 ENCOUNTER — Other Ambulatory Visit: Payer: Self-pay

## 2021-05-15 ENCOUNTER — Ambulatory Visit: Payer: Medicaid Other

## 2021-05-15 ENCOUNTER — Ambulatory Visit (LOCAL_COMMUNITY_HEALTH_CENTER): Payer: Medicaid Other

## 2021-05-15 VITALS — BP 100/70 | HR 71 | Resp 16 | Ht 66.0 in | Wt 198.0 lb

## 2021-05-15 DIAGNOSIS — Z3009 Encounter for other general counseling and advice on contraception: Secondary | ICD-10-CM

## 2021-05-15 DIAGNOSIS — Z3042 Encounter for surveillance of injectable contraceptive: Secondary | ICD-10-CM | POA: Diagnosis not present

## 2021-05-15 MED ORDER — MEDROXYPROGESTERONE ACETATE 150 MG/ML IM SUSP
150.0000 mg | Freq: Once | INTRAMUSCULAR | Status: AC
  Administered 2021-05-15: 150 mg via INTRAMUSCULAR

## 2021-05-15 NOTE — Progress Notes (Signed)
11 weeks 0 days post depo. Voices no concerns. Depo given today per C. [place], [place] dated 08/27/2020. Depo administered R. Deltoid tolerated well. Next Depo 07/31/2021 Reminder card given to patient for depo and next physical.

## 2021-08-05 ENCOUNTER — Ambulatory Visit (LOCAL_COMMUNITY_HEALTH_CENTER): Payer: Medicaid Other | Admitting: Physician Assistant

## 2021-08-05 ENCOUNTER — Other Ambulatory Visit: Payer: Self-pay

## 2021-08-05 VITALS — BP 122/80 | HR 81 | Ht 66.0 in | Wt 195.8 lb

## 2021-08-05 DIAGNOSIS — Z30013 Encounter for initial prescription of injectable contraceptive: Secondary | ICD-10-CM | POA: Diagnosis not present

## 2021-08-05 DIAGNOSIS — Z0000 Encounter for general adult medical examination without abnormal findings: Secondary | ICD-10-CM

## 2021-08-05 DIAGNOSIS — Z3009 Encounter for other general counseling and advice on contraception: Secondary | ICD-10-CM | POA: Diagnosis not present

## 2021-08-05 DIAGNOSIS — Z3042 Encounter for surveillance of injectable contraceptive: Secondary | ICD-10-CM

## 2021-08-05 MED ORDER — MEDROXYPROGESTERONE ACETATE 150 MG/ML IM SUSP
150.0000 mg | INTRAMUSCULAR | Status: DC
  Administered 2021-08-05: 150 mg via INTRAMUSCULAR

## 2021-08-05 NOTE — Progress Notes (Signed)
Patient here for PE. Depo given in L deltoid, no concerns or questions. Next depo date is 10/21/21. Condoms declined.

## 2021-08-05 NOTE — Progress Notes (Deleted)
Patient here for PE post partum visit. Providers orders completed. Information on [employer] given to patient. 

## 2021-08-08 ENCOUNTER — Encounter: Payer: Self-pay | Admitting: Physician Assistant

## 2021-08-08 NOTE — Progress Notes (Signed)
Family Planning Visit- Repeat Yearly Visit  Subjective:  Morgan Young is a 25 y.o. G1P0101  being seen today for an annual wellness visit and to discuss contraception options.   The patient is currently using Depo Provera for pregnancy prevention. Patient does not want a pregnancy in the next year. Patient has the following medical problems: has History of self-harm and Positive depression screening on their problem list.  Chief Complaint  Patient presents with   Contraception    Patient reports that she is doing well with Depo and desires to continue with this as her BCM.  Patient denies changes to her personal and family history since last annual visit.  Reports that she still has occasional migraines but no change in intensity or associated symptoms such as blurry vision or nausea.  States that she has noticed that the headaches are more frequent with increased stress and sometimes she will have one a few days after getting Depo or if it is time for her Depo.  Per chart review, CBE and pap are due in 2024.  Patient denies any other concerns.   See flowsheet for other program required questions.   Body mass index is 31.6 kg/m. - Patient is eligible for diabetes screening based on BMI and age >63?  not applicable HA1C ordered? not applicable  Patient reports 1 of partners in last year. Desires STI screening?  No - patient declines.   Has patient been screened once for HCV in the past?  No  No results found for: HCVAB  Does the patient have current of drug use, have a partner with drug use, and/or has been incarcerated since last result? No  If yes-- Screen for HCV through [facility]   Does the patient meet criteria for HBV testing? No  Criteria:  -Household, sexual or needle sharing contact with HBV -History of drug use -HIV positive -Those with known Hep C   Health Maintenance Due  Topic Date Due   COVID-19 Vaccine (1) Never done   HPV VACCINES (1 - 2-dose series)  Never done   Hepatitis C Screening  Never done   INFLUENZA VACCINE  Never done    Review of Systems  All other systems reviewed and are negative.  The following portions of the patient's history were reviewed and updated as appropriate: allergies, current medications, past family history, past medical history, past social history, past surgical history and problem list. Problem list updated.  Objective:   Vitals:   08/05/21 0932  BP: 122/80  Pulse: 81  Weight: 195 lb 12.8 oz (88.8 kg)  Height: 5\' 6"  (1.676 m)    Physical Exam Vitals and nursing note reviewed.  Constitutional:      General: She is not in acute distress.    Appearance: Normal appearance.  HENT:     Head: Normocephalic and atraumatic.     Mouth/Throat:     Mouth: Mucous membranes are moist.     Pharynx: Oropharynx is clear. No oropharyngeal exudate or posterior oropharyngeal erythema.  Eyes:     Conjunctiva/sclera: Conjunctivae normal.  Neck:     Thyroid: No thyroid mass, thyromegaly or thyroid tenderness.  Cardiovascular:     Rate and Rhythm: Normal rate and regular rhythm.  Pulmonary:     Effort: Pulmonary effort is normal.     Breath sounds: Normal breath sounds.  Abdominal:     Palpations: Abdomen is soft. There is no mass.     Tenderness: There is no abdominal tenderness.  There is no guarding or rebound.  Musculoskeletal:     Cervical back: Neck supple. No tenderness.  Lymphadenopathy:     Cervical: No cervical adenopathy.  Skin:    General: Skin is warm and dry.     Findings: No bruising, erythema, lesion or rash.  Neurological:     Mental Status: She is alert and oriented to person, place, and time.  Psychiatric:        Mood and Affect: Mood normal.        Behavior: Behavior normal.        Thought Content: Thought content normal.        Judgment: Judgment normal.      Assessment and Plan:  Morgan Young is a 25 y.o. female G1P0101 presenting to the [facility] Department for  an yearly wellness and contraception visit  Contraception counseling: Reviewed all forms of birth control options in the tiered based approach. available including abstinence; over the counter/barrier methods; hormonal contraceptive medication including pill, patch, ring, injection,contraceptive implant, ECP; hormonal and nonhormonal IUDs; permanent sterilization options including vasectomy and the various tubal sterilization modalities. Risks, benefits, and typical effectiveness rates were reviewed.  Questions were answered.  Written information was also given to the patient to review.  Patient desires to continue with Depo, this was prescribed for patient. She will follow up in  3 months and prn for surveillance.  She was told to call with any further questions, or with any concerns about this method of contraception.  Emphasized use of condoms 100% of the time for STI prevention.  Patient was not a candidate for ECP today.    1. Encounter for counseling regarding contraception Reviewed with patient as above re: BCM options. Reviewed with patient normal SE of Depo and when to call clinic with concerns. Enc condoms with all sex for STD protection.   2. Well woman exam (no gynecological exam) Reviewed with patient healthy habits to maintain general health. Enc MVI 1 po daily. Enc to follow up with PCP if headaches worsen, change or are not relieved with OTC analgesics. Enc to establish with/ follow up with PCP for primary care concerns, age appropriate screenings and illness.   3. Surveillance for Depo-Provera contraception Continue with Depo 150 mg IM q 11-13 weeks for 1 year. - medroxyPROGESTERone (DEPO-PROVERA) injection 150 mg     No follow-ups on file.  No future appointments.  Matt Holmes, PA

## 2021-10-18 IMAGING — US US OB < 14 WEEKS - US OB TV
1 series · 14 of 28 positions shown · non-contrast
Comparison: None.

CLINICAL DATA: 23-year-old pregnant female with vaginal bleeding.
LMP: 08/01/2019 corresponding to an estimated gestational age of 10
weeks, 3 days.

EXAM:
OBSTETRIC <14 WK US AND TRANSVAGINAL OB US
TECHNIQUE: Both transabdominal and transvaginal ultrasound examinations were
performed for complete evaluation of the gestation as well as the
maternal uterus, adnexal regions, and pelvic cul-de-sac.
Transvaginal technique was performed to assess early pregnancy.

[Series 1: us ob < 14 weeks - us ob tv · 14 of 137 slices shown]
[im 6/137]
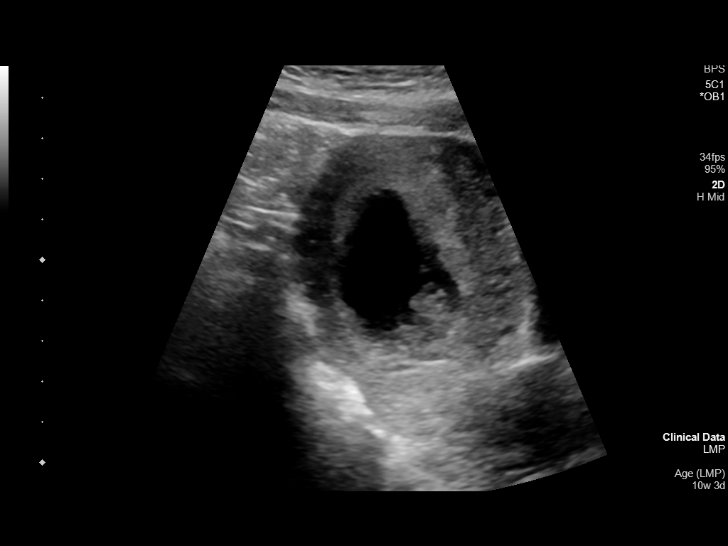
[im 16/137]
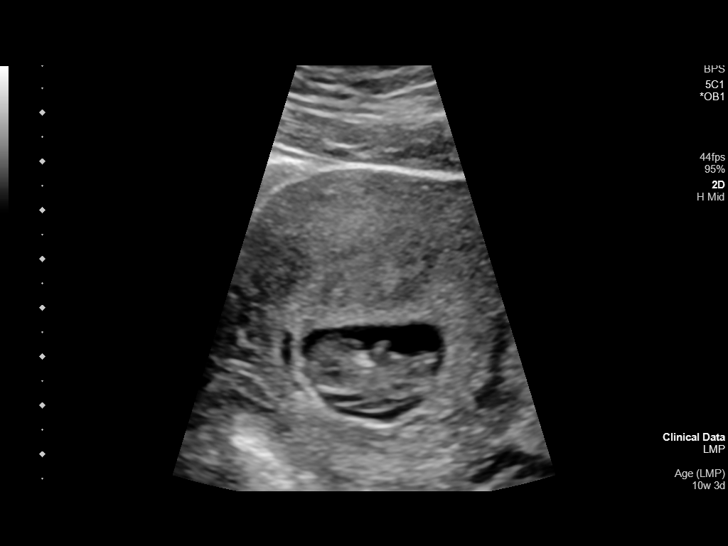
[im 26/137]
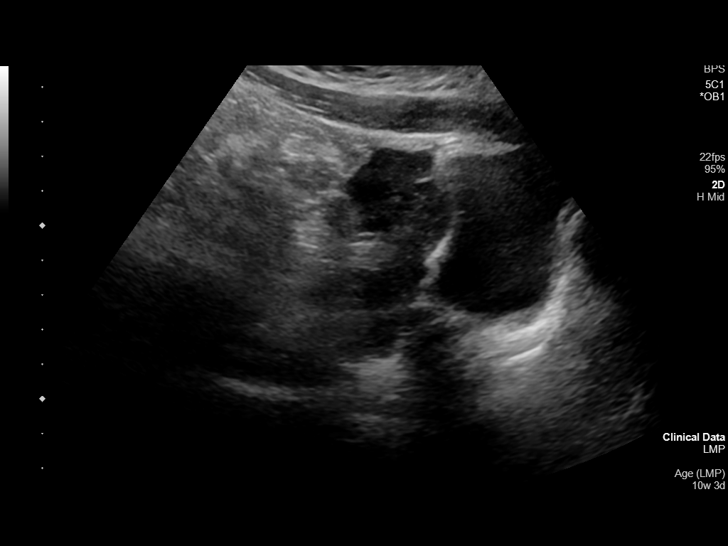
[im 36/137]
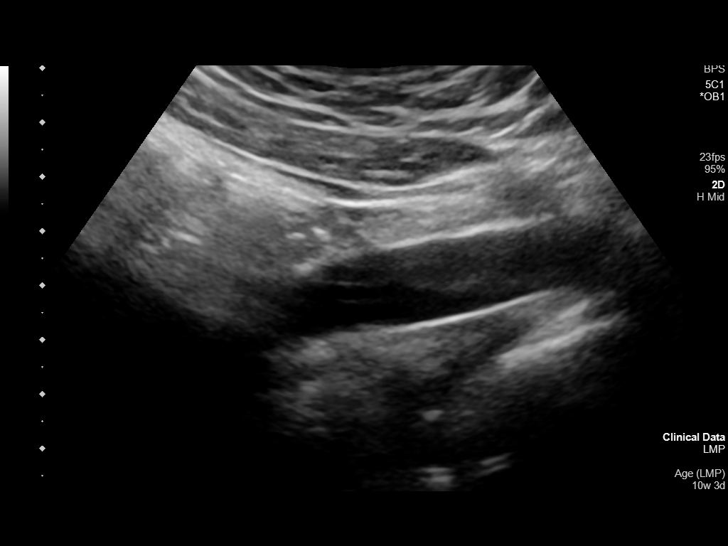
[im 46/137]
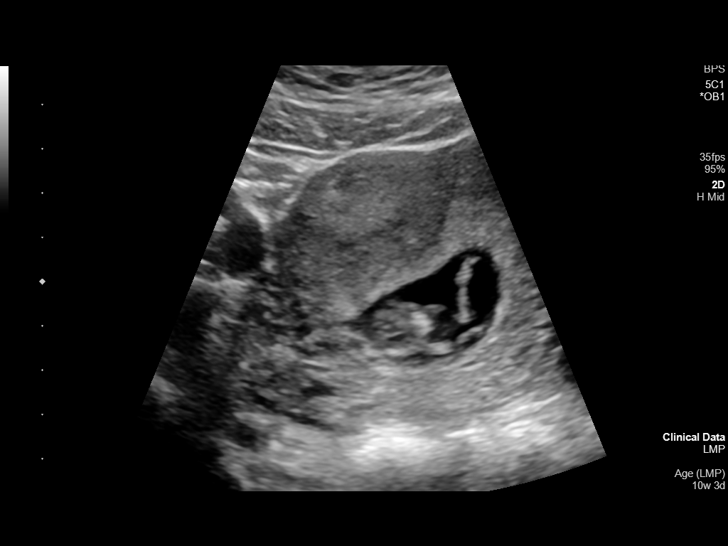
[im 56/137]
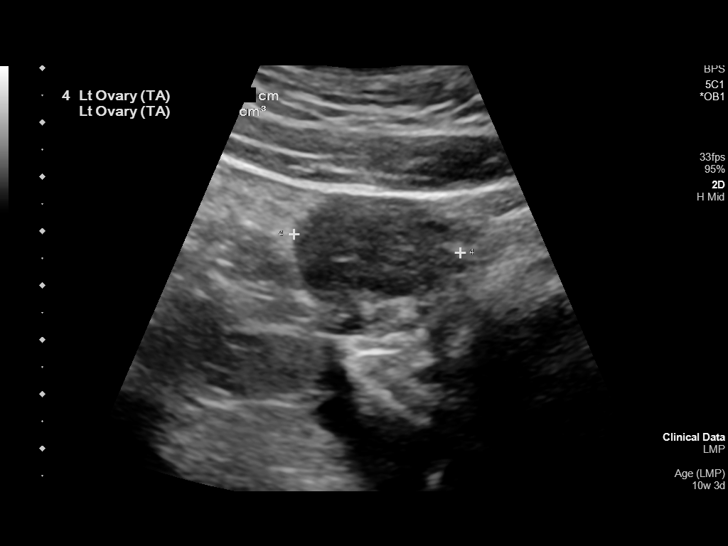
[im 66/137]
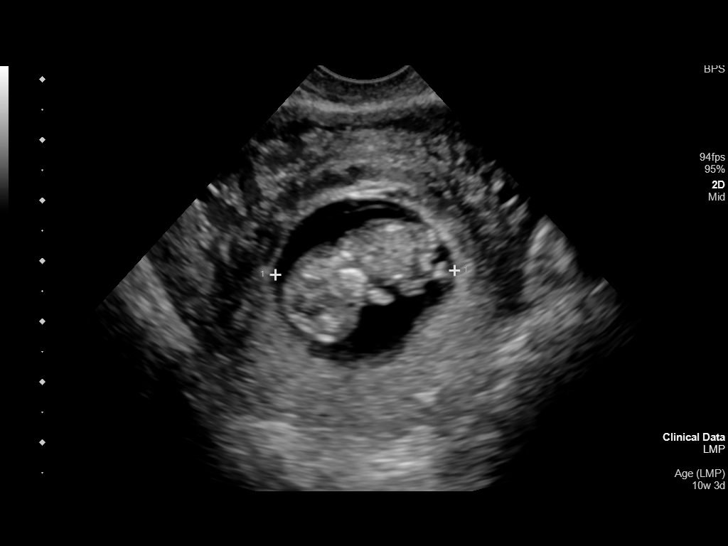
[im 76/137]
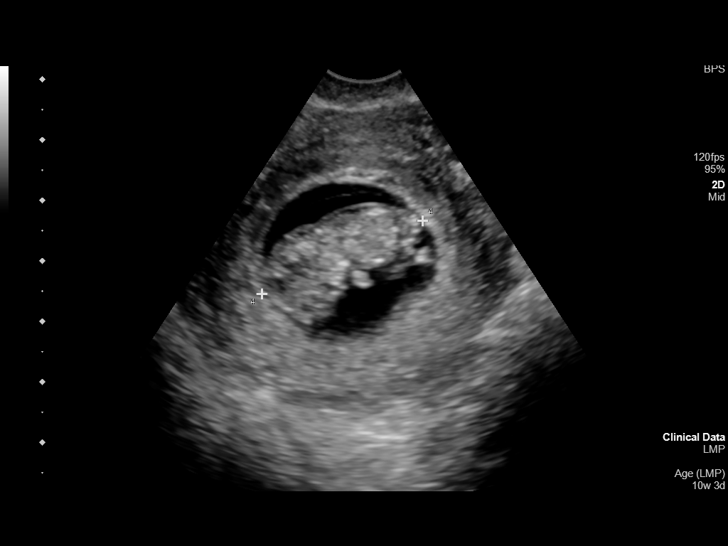
[im 86/137]
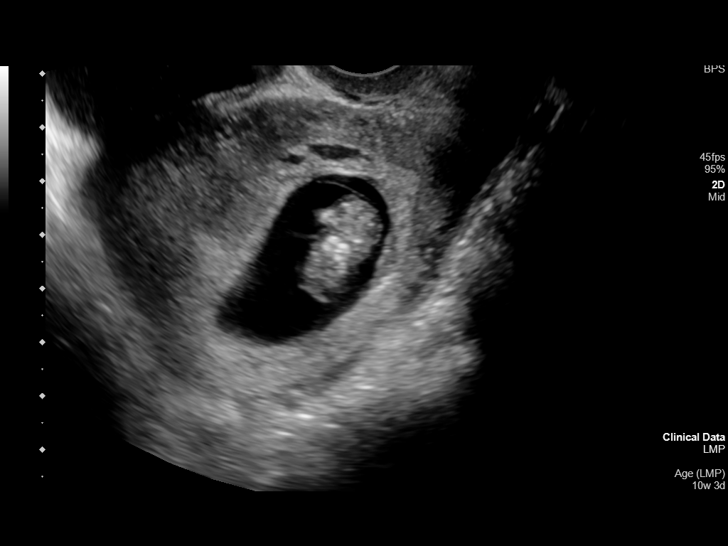
[im 96/137]
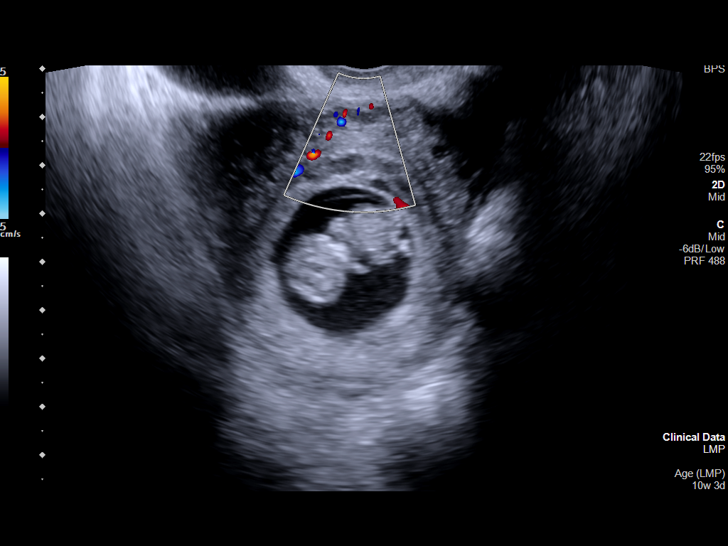
[im 106/137]
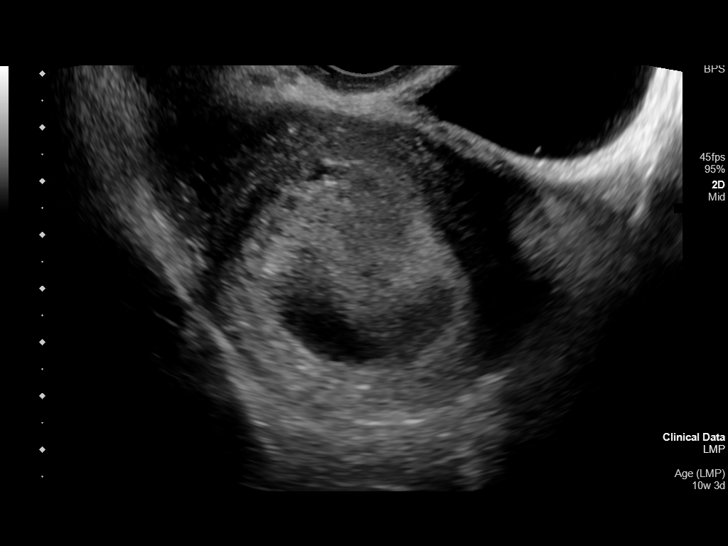
[im 116/137]
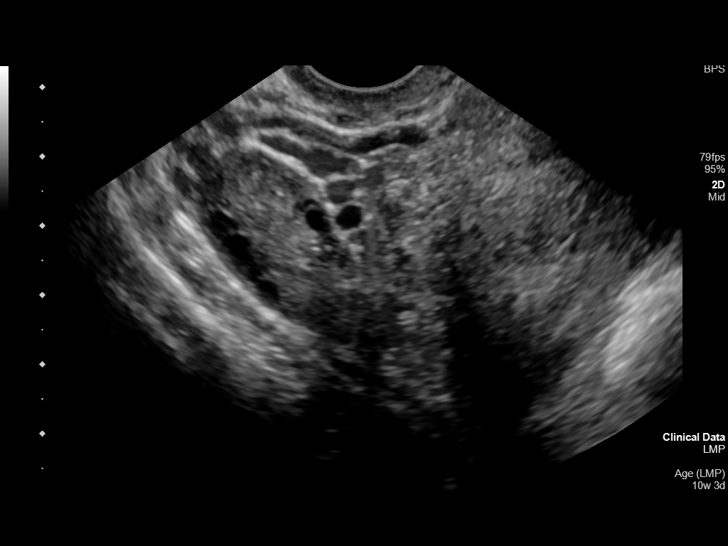
[im 126/137]
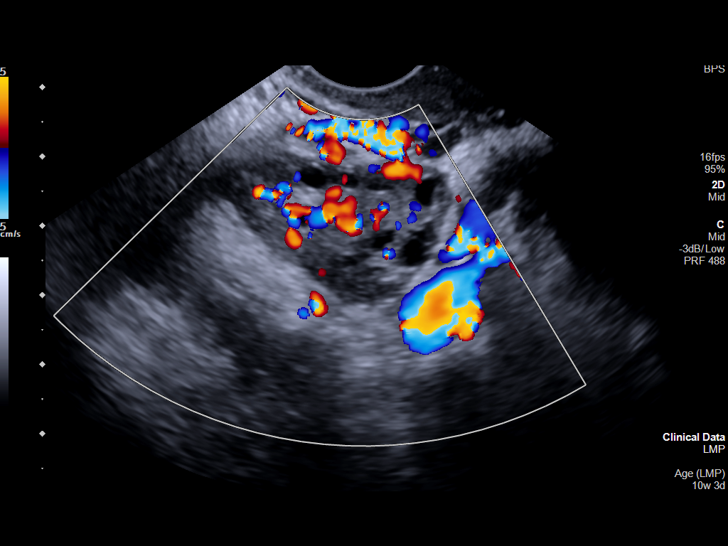
[im 137/137]
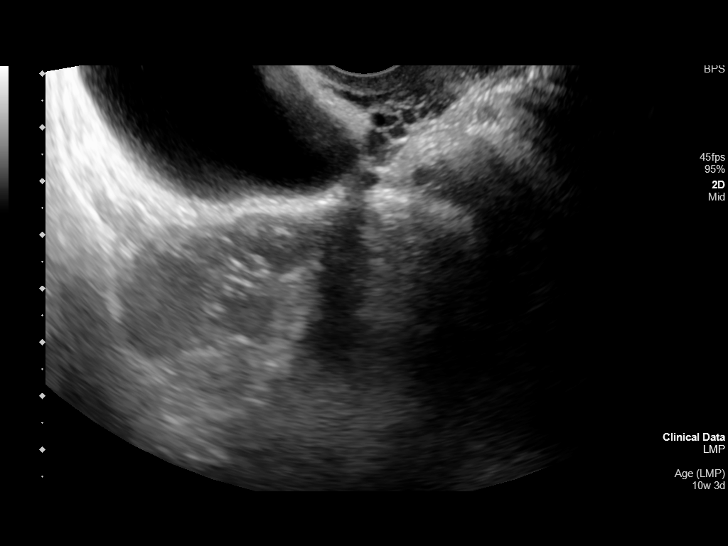

[14 of 28 positions shown; findings below may reference images not displayed]

FINDINGS: Intrauterine gestational sac: Single intrauterine gestational sac.

Yolk sac:  Seen

Embryo:  Present

Cardiac Activity: Detected

Heart Rate: 173 bpm

CRL:  28 mm   9 w   4 d                  US EDC: 05/14/2019

Subchorionic hemorrhage: Vascular channel versus a small
subchorionic hemorrhage measuring 12 x 3 mm.

Maternal uterus/adnexae: The ovaries are unremarkable.
IMPRESSION: Single live intrauterine pregnancy with an estimated gestational age
of 9 weeks, 4 days.

## 2021-10-25 ENCOUNTER — Other Ambulatory Visit: Payer: Self-pay

## 2021-10-25 ENCOUNTER — Ambulatory Visit (LOCAL_COMMUNITY_HEALTH_CENTER): Payer: Medicaid Other

## 2021-10-25 VITALS — BP 116/73 | Ht 66.0 in | Wt 196.5 lb

## 2021-10-25 DIAGNOSIS — Z3042 Encounter for surveillance of injectable contraceptive: Secondary | ICD-10-CM | POA: Diagnosis not present

## 2021-10-25 DIAGNOSIS — Z3009 Encounter for other general counseling and advice on contraception: Secondary | ICD-10-CM | POA: Diagnosis not present

## 2021-10-25 NOTE — Progress Notes (Signed)
DMPA 150 mg given IM (Right Deltoid) today per 08/05/21 order by Sadie Haber, PA and tolerated well.  Patient is 11w 4d post last Depo. Hart Carwin, RN

## 2022-05-20 NOTE — Progress Notes (Signed)
I,Morgan Young,acting as a Neurosurgeon for [employer], PA-C.,have documented all relevant documentation on the behalf of Morgan Ferguson, PA-C,as directed by  Morgan Ferguson, PA-C while in the presence of Morgan Ferguson, PA-C.  New patient visit   Patient: Morgan Young   DOB: 03/22/1996   26 y.o. Female  MRN: [id number] Visit Date: 05/21/2022  Today's healthcare provider: [name], PA-C   Cc. Establish care, weight gain  Subjective    Morgan Young is a 26 y.o. female who presents today as a new patient to establish care.  HPI   Morgan Young has questions about weight loss, she has been unable to lose weight since giving birth in 2021 despite diet changes.  Past Medical History:  Diagnosis Date   Allergy    Anxiety    History of self-harm    History of self-harm    cutting-last 2011   History of urinary tract infection    Scoliosis    Past Surgical History:  Procedure Laterality Date   Denies surgical hx     Family Status  Relation Name Status   Mother  Alive   Father unsure (Not Specified)   Sister  Alive   Daughter  Alive   MGM  Deceased   MGF  Alive   PGM unsure Alive   PGF unsure Alive   Family History  Problem Relation Age of Onset   Asthma Mother    Hypertension Mother    Diabetes Mellitus I Mother        Chaned to type 1 2 weeks ago   Hyperlipidemia Mother    Liver disease Mother    Depression Mother    Other Father        unknown medical history   Diabetes Sister    Heart murmur Daughter    Hypertension Maternal Grandmother    Hyperlipidemia Maternal Grandmother    HIV/AIDS Maternal Grandmother    Pancreatic cancer Maternal Grandfather    Social History   Socioeconomic History   Marital status: Single    Spouse name: Not on file   Number of children: 1   Years of education: 12   Highest education level: High school graduate  Occupational History   Occupation: Civil Service fast streamer, Naval architect  Tobacco Use   Smoking status: Never   Smokeless  tobacco: Never  Vaping Use   Vaping Use: Every day  Substance and Sexual Activity   Alcohol use: Not Currently    Comment: Last ETOH use approx 1 year ago   Drug use: Not Currently    Types: Marijuana    Comment: Last use January or February 2020.   Sexual activity: Yes  Other Topics Concern   Not on file  Social History Narrative   Not on file   Social Determinants of Health   Financial Resource Strain: Low Risk  (10/17/2019)   Overall Financial Resource Strain (CARDIA)    Difficulty of Paying Living Expenses: Not very hard  Food Insecurity: No Food Insecurity (10/17/2019)   Hunger Vital Sign    Worried About Running Out of Food in the Last Year: Never true    Ran Out of Food in the Last Year: Never true  Transportation Needs: No Transportation Needs (10/17/2019)   PRAPARE - Administrator, Civil Service (Medical): No    Lack of Transportation (Non-Medical): No  Physical Activity: Not on file  Stress: Not on file  Social Connections: Not on file   Outpatient Medications Prior to Visit  Medication Sig   [DISCONTINUED] ondansetron (ZOFRAN ODT) 4 MG disintegrating tablet Take 1 tablet (4 mg total) by mouth every 8 (eight) hours as needed for nausea or vomiting. (Patient not taking: Reported on 05/31/2020)   [DISCONTINUED] Prenatal Vit-Fe Fumarate-FA (PRENATAL VITAMIN) 27-0.8 MG TABS Take 1 tablet by mouth daily at 6 (six) AM. (Patient not taking: Reported on 05/15/2021)   [DISCONTINUED] medroxyPROGESTERone (DEPO-PROVERA) injection 150 mg    No facility-administered medications prior to visit.   Allergies  Allergen Reactions   Peanut-Containing Drug Products Hives   Cantaloupe (Diagnostic) Swelling    Rash on face, swelling in eyes   Watermelon [Citrullus Vulgaris] Hives    Immunization History  Administered Date(s) Administered   HPV Quadrivalent 06/07/2007, 05/03/2014, 01/19/2015   Tdap 02/22/2020    Health Maintenance  Topic Date Due   COVID-19 Vaccine  (1) Never done   Hepatitis C Screening  Never done   INFLUENZA VACCINE  06/24/2022   PAP-Cervical Cytology Screening  08/28/2023   PAP SMEAR-Modifier  08/28/2023   TETANUS/TDAP  02/21/2030   HPV VACCINES  Completed   HIV Screening  Completed    Patient Care Team: Morgan Ferguson, PA-C as PCP - General (Physician Assistant)  Review of Systems  Constitutional:  Positive for unexpected weight change.  Gastrointestinal:  Positive for abdominal distention.  Musculoskeletal:  Positive for back pain.  Allergic/Immunologic: Positive for environmental allergies and food allergies.     Objective    Blood pressure 118/80, pulse 78, height 5\' 7"  (1.702 m), weight 201 lb 6.4 oz (91.4 kg), SpO2 99 %.   Physical Exam Constitutional:      General: She is awake.     Appearance: She is well-developed.  HENT:     Head: Normocephalic.  Eyes:     Conjunctiva/sclera: Conjunctivae normal.  Cardiovascular:     Rate and Rhythm: Normal rate and regular rhythm.     Heart sounds: Normal heart sounds.  Pulmonary:     Effort: Pulmonary effort is normal.     Breath sounds: Normal breath sounds.  Skin:    General: Skin is warm.  Neurological:     Mental Status: She is alert and oriented to person, place, and time.  Psychiatric:        Attention and Perception: Attention normal.        Mood and Affect: Mood normal.        Speech: Speech normal.        Behavior: Behavior is cooperative.    Depression Screen    05/21/2022    9:00 AM 08/05/2021    9:36 AM 05/31/2020    1:59 PM 10/17/2019   12:15 PM  PHQ 2/9 Scores  PHQ - 2 Score 0 0 2 0  PHQ- 9 Score 0   1   No results found for any visits on 05/21/22.  Assessment & Plan      Problem List Items Addressed This Visit       Other   Obesity (BMI 30-39.9) - Primary    Discussed diet and exercise.  Ref to dietician Will check cmp, tsh/t4, cbc, lipids, a1c      Relevant Orders   CBC with Differential/Platelet   Comprehensive metabolic  panel   TSH + free T4   Lipid panel   HgB A1c   Amb ref to Medical Nutrition Therapy-MNT   Seasonal allergies    Used to see an allergist, requests referral again      Relevant Orders  Ambulatory referral to Allergy   Nut allergy    Typically worst to pecans Advised to have epipen on hand, instructed on use      Relevant Medications   EPINEPHrine (EPIPEN 2-PAK) 0.3 mg/0.3 mL IJ SOAJ injection   Other Visit Diagnoses     Encounter for hepatitis C screening test for low risk patient       Relevant Orders   Hepatitis C antibody       Return in about 1 year (around 05/22/2023) for CPE.     I, Morgan Ferguson, PA-C have reviewed all documentation for this visit. The documentation on  @CurDate @  for the exam, diagnosis, procedures, and orders are all accurate and complete.  , PA-C [facility] [address] [place], [place], [place] Office: [phone] Fax: [phone]   [facility]

## 2022-05-21 ENCOUNTER — Encounter: Payer: Self-pay | Admitting: Physician Assistant

## 2022-05-21 ENCOUNTER — Ambulatory Visit (INDEPENDENT_AMBULATORY_CARE_PROVIDER_SITE_OTHER): Payer: Medicaid Other | Admitting: Physician Assistant

## 2022-05-21 VITALS — BP 118/80 | HR 78 | Ht 67.0 in | Wt 201.4 lb

## 2022-05-21 DIAGNOSIS — E669 Obesity, unspecified: Secondary | ICD-10-CM | POA: Diagnosis not present

## 2022-05-21 DIAGNOSIS — Z91018 Allergy to other foods: Secondary | ICD-10-CM

## 2022-05-21 DIAGNOSIS — J302 Other seasonal allergic rhinitis: Secondary | ICD-10-CM | POA: Diagnosis not present

## 2022-05-21 DIAGNOSIS — Z1159 Encounter for screening for other viral diseases: Secondary | ICD-10-CM

## 2022-05-21 MED ORDER — EPINEPHRINE 0.3 MG/0.3ML IJ SOAJ: 0.3000 mg | INTRAMUSCULAR | 0 refills | Status: AC | PRN

## 2022-05-21 NOTE — Patient Instructions (Addendum)
[email or web address]  [id number]  Emergency Mental Health Services:  [facility] (like an urgent care for mental health) [address] [place], [place] [address] [phone]  [facility] Several different mental health services, has a crisis center  [place], [place] [address], [place], [place] [address]  [phone] Same-Day Access Hours:  Monday, Wednesday and Friday, 8am - 3pm Crisis & Diversion Center: Walk-In Crisis Hours: 7 days/week, 8am - 12am -  [employer] -  Patent examiner Drop-Off (side door)

## 2022-05-21 NOTE — Assessment & Plan Note (Signed)
Discussed diet and exercise.  Ref to dietician Will check cmp, tsh/t4, cbc, lipids, a1c

## 2022-05-21 NOTE — Assessment & Plan Note (Signed)
Typically worst to pecans Advised to have epipen on hand, instructed on use

## 2022-05-21 NOTE — Assessment & Plan Note (Signed)
Used to see an allergist, requests referral again

## 2022-05-22 LAB — COMPREHENSIVE METABOLIC PANEL
ALT: 9 IU/L (ref 0–32)
AST: 9 IU/L (ref 0–40)
Albumin/Globulin Ratio: 1.5 (ref 1.2–2.2)
Albumin: 4.3 g/dL (ref 3.9–5.0)
Alkaline Phosphatase: 81 IU/L (ref 44–121)
BUN/Creatinine Ratio: 13 (ref 9–23)
BUN: 10 mg/dL (ref 6–20)
Bilirubin Total: 0.5 mg/dL (ref 0.0–1.2)
CO2: 23 mmol/L (ref 20–29)
Calcium: 9.1 mg/dL (ref 8.7–10.2)
Chloride: 102 mmol/L (ref 96–106)
Creatinine, Ser: 0.75 mg/dL (ref 0.57–1.00)
Globulin, Total: 2.9 g/dL (ref 1.5–4.5)
Glucose: 99 mg/dL (ref 70–99)
Potassium: 4.1 mmol/L (ref 3.5–5.2)
Sodium: 139 mmol/L (ref 134–144)
Total Protein: 7.2 g/dL (ref 6.0–8.5)
eGFR: 113 mL/min/{1.73_m2} (ref >59)

## 2022-05-22 LAB — CBC WITH DIFFERENTIAL/PLATELET
Basophils Absolute: 0 10*3/uL (ref 0.0–0.2)
Basos: 1 %
EOS (ABSOLUTE): 0.2 10*3/uL (ref 0.0–0.4)
Eos: 6 %
Hematocrit: 39.2 % (ref 34.0–46.6)
Hemoglobin: 12.6 g/dL (ref 11.1–15.9)
Immature Grans (Abs): 0 10*3/uL (ref 0.0–0.1)
Immature Granulocytes: 0 %
Lymphocytes Absolute: 1.4 10*3/uL (ref 0.7–3.1)
Lymphs: 36 %
MCH: 29.3 pg (ref 26.6–33.0)
MCHC: 32.1 g/dL (ref 31.5–35.7)
MCV: 91 fL (ref 79–97)
Monocytes Absolute: 0.3 10*3/uL (ref 0.1–0.9)
Monocytes: 9 %
Neutrophils Absolute: 2 10*3/uL (ref 1.4–7.0)
Neutrophils: 48 %
Platelets: 292 10*3/uL (ref 150–450)
RBC: 4.3 x10E6/uL (ref 3.77–5.28)
RDW: 13 % (ref 11.7–15.4)
WBC: 4 10*3/uL (ref 3.4–10.8)

## 2022-05-22 LAB — LIPID PANEL
Chol/HDL Ratio: 3.4 ratio (ref 0.0–4.4)
Cholesterol, Total: 178 mg/dL (ref 100–199)
HDL: 52 mg/dL (ref >39)
LDL Chol Calc (NIH): 110 mg/dL — ABNORMAL HIGH (ref 0–99)
Triglycerides: 88 mg/dL (ref 0–149)
VLDL Cholesterol Cal: 16 mg/dL (ref 5–40)

## 2022-05-22 LAB — TSH+FREE T4
Free T4: 1.09 ng/dL (ref 0.82–1.77)
TSH: 2.33 u[IU]/mL (ref 0.450–4.500)

## 2022-05-22 LAB — HEPATITIS C ANTIBODY: Hep C Virus Ab: NONREACTIVE

## 2022-05-22 LAB — HEMOGLOBIN A1C
Est. average glucose Bld gHb Est-mCnc: 114 mg/dL
Hgb A1c MFr Bld: 5.6 % (ref 4.8–5.6)

## 2022-07-16 ENCOUNTER — Ambulatory Visit: Payer: Medicaid Other | Admitting: Dietician

## 2022-11-24 DIAGNOSIS — N83209 Unspecified ovarian cyst, unspecified side: Secondary | ICD-10-CM

## 2022-11-24 HISTORY — DX: Unspecified ovarian cyst, unspecified side: N83.209

## 2023-05-25 ENCOUNTER — Ambulatory Visit (INDEPENDENT_AMBULATORY_CARE_PROVIDER_SITE_OTHER): Payer: Medicaid Other | Admitting: Physician Assistant

## 2023-05-25 ENCOUNTER — Encounter: Payer: Self-pay | Admitting: Physician Assistant

## 2023-05-25 VITALS — BP 110/67 | HR 70 | Ht 67.0 in | Wt 219.9 lb

## 2023-05-25 DIAGNOSIS — E782 Mixed hyperlipidemia: Secondary | ICD-10-CM

## 2023-05-25 DIAGNOSIS — R5383 Other fatigue: Secondary | ICD-10-CM

## 2023-05-25 DIAGNOSIS — F320 Major depressive disorder, single episode, mild: Secondary | ICD-10-CM | POA: Diagnosis not present

## 2023-05-25 DIAGNOSIS — Z0000 Encounter for general adult medical examination without abnormal findings: Secondary | ICD-10-CM

## 2023-05-25 DIAGNOSIS — N926 Irregular menstruation, unspecified: Secondary | ICD-10-CM

## 2023-05-25 DIAGNOSIS — E669 Obesity, unspecified: Secondary | ICD-10-CM

## 2023-05-25 NOTE — Assessment & Plan Note (Signed)
Reports of excessive sleep, lack of interest in activities, difficulty maintaining employment due to lack of motivation, and recent bereavements. Patient has a history of depression and acknowledges current symptoms are similar. -Referral to a therapist for counseling. -Consideration of starting Wellbutrin, discussed benefits and side effects with patient. Decision deferred to future visit. -check other labs related to fatigue

## 2023-05-25 NOTE — Assessment & Plan Note (Signed)
Reports of excessive sleep and lack of energy. -Recommend starting a vitamin B complex supplement -Order labs to check thyroid, iron, and vitamin B12 levels to rule out physiological causes of fatigue.

## 2023-05-25 NOTE — Assessment & Plan Note (Signed)
Reports of weight gain since discontinuing birth control and difficulty losing weight. -Order labs to check thyroid, iron, and vitamin B12 levels to rule out physiological causes of weight gain. -Recommend starting a vitamin D supplement.

## 2023-05-25 NOTE — Progress Notes (Signed)
Complete physical exam   Patient: Morgan Young   DOB: 02-20-1996   27 y.o. Female  MRN: [id number] Visit Date: 05/25/2023  Today's healthcare provider: [name], PA-C   Chief Complaint  Patient presents with   Annual Exam   Subjective    Morgan Young is a 27 y.o. female who presents today for a complete physical exam.   Discussed the use of AI scribe software for clinical note transcription with the patient, who gave verbal consent to proceed.  History of Present Illness   The patient presents with increased fatigue, excessive sleep, and a lack of motivation affecting her ability to maintain employment. She reports a pattern of leaving jobs after a few days due to an overwhelming feeling and a lack of interest. This has been ongoing for a significant period, with the patient having held three different jobs. She also reports a decreased interest in activities she previously enjoyed.  The patient's symptoms have worsened recently, with sleep being the only activity that brings her happiness. She expresses concern about the impact of her symptoms on her personal life, particularly her relationship with her fianc and her role as a parent to a three-year-old. The patient acknowledges feeling stressed and overwhelmed, attributing some of this to recent losses in her life, including a friend to suicide and a sister-in-law.  The patient has not previously been on medication for depression and has not seen a therapist, although she was supposed to after the birth of her child. She expresses a desire to improve her situation and return to her previous state of functioning.   She also reports irregular periods since stopping birth control 5/23. Still occurs semi-monthly but never exactly on time.       Past Medical History:  Diagnosis Date   Allergy    Anxiety    History of self-harm    History of self-harm    cutting-last 2011   History of urinary tract infection     Scoliosis    Past Surgical History:  Procedure Laterality Date   Denies surgical hx     Social History   Socioeconomic History   Marital status: Single    Spouse name: Not on file   Number of children: 1   Years of education: 53   Highest education level: High school graduate  Occupational History   Occupation: Civil Service fast streamer, Naval architect  Tobacco Use   Smoking status: Never   Smokeless tobacco: Never  Vaping Use   Vaping Use: Every day  Substance and Sexual Activity   Alcohol use: Not Currently    Comment: Last ETOH use approx 1 year ago   Drug use: Not Currently    Types: Marijuana    Comment: Last use January or February 2020.   Sexual activity: Yes    Birth control/protection: None  Other Topics Concern   Not on file  Social History Narrative   Not on file   Social Determinants of Health   Financial Resource Strain: Low Risk  (05/25/2023)   Overall Financial Resource Strain (CARDIA)    Difficulty of Paying Living Expenses: Not hard at all  Food Insecurity: No Food Insecurity (05/25/2023)   Hunger Vital Sign    Worried About Running Out of Food in the Last Year: Never true    Ran Out of Food in the Last Year: Never true  Transportation Needs: No Transportation Needs (05/25/2023)   PRAPARE - Administrator, Civil Service (Medical): No  Lack of Transportation (Non-Medical): No  Physical Activity: Inactive (05/25/2023)   Exercise Vital Sign    Days of Exercise per Week: 0 days    Minutes of Exercise per Session: 0 min  Stress: Stress Concern Present (05/25/2023)   [employer] of Occupational Health - Occupational Stress Questionnaire    Feeling of Stress : To some extent  Social Connections: Moderately Isolated (05/25/2023)   Social Connection and Isolation Panel [NHANES]    Frequency of Communication with Friends and Family: Three times a week    Frequency of Social Gatherings with Friends and Family: Not on file    Attends Religious Services: Never    Active  Member of Clubs or Organizations: No    Attends Banker Meetings: Never    Marital Status: Living with partner  Intimate Partner Violence: Not At Risk (08/05/2021)   Humiliation, Afraid, Rape, and Kick questionnaire    Fear of Current or Ex-Partner: No    Emotionally Abused: No    Physically Abused: No    Sexually Abused: No   Family Status  Relation Name Status   Mother Roque Cash Alive   Father unsure (Not Specified)   Sister Psychologist, clinical Alive   Daughter  Alive   MGM Juanita massey Deceased   MGF  Alive   PGM unsure Alive   PGF unsure Alive   Family History  Problem Relation Age of Onset   Asthma Mother    Hypertension Mother    Diabetes Mellitus I Mother        Chaned to type 1 2 weeks ago   Hyperlipidemia Mother    Liver disease Mother    Depression Mother    Other Father        unknown medical history   Diabetes Sister    Heart murmur Daughter    Hypertension Maternal Grandmother    Hyperlipidemia Maternal Grandmother    HIV/AIDS Maternal Grandmother    Pancreatic cancer Maternal Grandfather    Allergies  Allergen Reactions   Peanut-Containing Drug Products Hives   Cantaloupe (Diagnostic) Swelling    Rash on face, swelling in eyes   Watermelon [Citrullus Vulgaris] Hives    Patient Care Team: [name], PA-C as PCP - General (Physician Assistant)   Medications: Outpatient Medications Prior to Visit  Medication Sig   EPINEPHrine (EPIPEN 2-PAK) 0.3 mg/0.3 mL IJ SOAJ injection Inject 0.3 mg into the muscle as needed for anaphylaxis.   levocetirizine (XYZAL) 5 MG tablet Take 5 mg by mouth daily.   montelukast (SINGULAIR) 10 MG tablet Take 10 mg by mouth daily.   triamcinolone ointment (KENALOG) 0.1 % SMARTSIG:Sparingly Topical Twice Daily PRN   No facility-administered medications prior to visit.    Review of Systems  Constitutional:  Positive for fatigue and unexpected weight change.    Objective    BP 110/67 (BP Location: Left  Arm, Patient Position: Sitting, Cuff Size: Normal)   Pulse 70   Ht 5\' 7"  (1.702 m)   Wt 219 lb 14.4 oz (99.7 kg)   LMP 05/11/2023   SpO2 100%   BMI 34.44 kg/m   Physical Exam Constitutional:      General: She is awake.     Appearance: She is well-developed. She is not ill-appearing.  HENT:     Head: Normocephalic.     Right Ear: Tympanic membrane normal.     Left Ear: Tympanic membrane normal.     Nose: Nose normal. No congestion or rhinorrhea.  Mouth/Throat:     Pharynx: No oropharyngeal exudate or posterior oropharyngeal erythema.  Eyes:     Conjunctiva/sclera: Conjunctivae normal.     Pupils: Pupils are equal, round, and reactive to light.  Neck:     Thyroid: No thyroid mass or thyromegaly.  Cardiovascular:     Rate and Rhythm: Normal rate and regular rhythm.     Heart sounds: Normal heart sounds.  Pulmonary:     Effort: Pulmonary effort is normal.     Breath sounds: Normal breath sounds.  Abdominal:     Palpations: Abdomen is soft.     Tenderness: There is no abdominal tenderness.  Musculoskeletal:     Right lower leg: No swelling. No edema.     Left lower leg: No swelling. No edema.  Lymphadenopathy:     Cervical: No cervical adenopathy.  Skin:    General: Skin is warm.  Neurological:     Mental Status: She is alert and oriented to person, place, and time.  Psychiatric:        Attention and Perception: Attention normal.        Mood and Affect: Mood normal.        Speech: Speech normal.        Behavior: Behavior normal. Behavior is cooperative.     Last depression screening scores    05/25/2023    9:13 AM 05/21/2022    9:00 AM 08/05/2021    9:36 AM  PHQ 2/9 Scores  PHQ - 2 Score 1 0 0  PHQ- 9 Score  0    Last fall risk screening    05/25/2023    9:13 AM  Fall Risk   Falls in the past year? 0  Injury with Fall? 0  Risk for fall due to : No Fall Risks  Follow up Falls evaluation completed   Last Audit-C alcohol use screening    05/25/2023    9:14  AM  Alcohol Use Disorder Test (AUDIT)  1. How often do you have a drink containing alcohol? 1  2. How many drinks containing alcohol do you have on a typical day when you are drinking? 1  3. How often do you have six or more drinks on one occasion? 0  AUDIT-C Score 2   A score of 3 or more in women, and 4 or more in men indicates increased risk for alcohol abuse, EXCEPT if all of the points are from question 1   No results found for any visits on 05/25/23.  Assessment & Plan    Routine Health Maintenance and Physical Exam  Exercise Activities and Dietary recommendations --balanced diet high in fiber and protein, low in sugars, carbs, fats. --physical activity/exercise 30 minutes 3-5 times a week     Immunization History  Administered Date(s) Administered   HPV Quadrivalent 06/07/2007, 05/03/2014, 01/19/2015   Tdap 02/22/2020    Health Maintenance  Topic Date Due   COVID-19 Vaccine (1) Never done   INFLUENZA VACCINE  06/25/2023   PAP-Cervical Cytology Screening  08/28/2023   PAP SMEAR-Modifier  08/28/2023   DTaP/Tdap/Td (2 - Td or Tdap) 02/21/2030   HPV VACCINES  Completed   Hepatitis C Screening  Completed   HIV Screening  Completed    Discussed health benefits of physical activity, and encouraged her to engage in regular exercise appropriate for her age and condition.  Problem List Items Addressed This Visit       Other   Obesity (BMI 30-39.9)    Reports of  weight gain since discontinuing birth control and difficulty losing weight. -Order labs to check thyroid, iron, and vitamin B12 levels to rule out physiological causes of weight gain. -Recommend starting a vitamin D supplement.      Depression, major, single episode, mild (HCC)     Reports of excessive sleep, lack of interest in activities, difficulty maintaining employment due to lack of motivation, and recent bereavements. Patient has a history of depression and acknowledges current symptoms are  similar. -Referral to a therapist for counseling. -Consideration of starting Wellbutrin, discussed benefits and side effects with patient. Decision deferred to future visit. -check other labs related to fatigue      Relevant Orders   Ambulatory referral to Psychology   Other fatigue    Reports of excessive sleep and lack of energy. -Recommend starting a vitamin B complex supplement -Order labs to check thyroid, iron, and vitamin B12 levels to rule out physiological causes of fatigue.      Relevant Orders   Iron, TIBC and Ferritin Panel   Vitamin B12   TSH + free T4   Other Visit Diagnoses     Annual physical exam    -  Primary   Relevant Orders   CBC w/Diff/Platelet   Comprehensive Metabolic Panel (CMET)   Moderate mixed hyperlipidemia not requiring statin therapy       Relevant Orders   Lipid Profile   Irregular menses          Irregular Menstruation:  -No desire to restart birth control. -Order labs   General Health Maintenance: -Continue regular Pap smear screenings, due in the fall of the following year.        Return in about 6 months (around 11/25/2023) for depression.     I, [name], PA-C have reviewed all documentation for this visit. The documentation on  05/25/23   for the exam, diagnosis, procedures, and orders are all accurate and complete.  [name], PA-C [facility] [address] [place], [place], [address] Office: [phone] Fax: [phone]   [facility]

## 2023-05-26 LAB — CBC WITH DIFFERENTIAL/PLATELET
Basophils Absolute: 0 10*3/uL (ref 0.0–0.2)
Basos: 1 %
EOS (ABSOLUTE): 0.3 10*3/uL (ref 0.0–0.4)
Eos: 5 %
Hematocrit: 40.1 % (ref 34.0–46.6)
Hemoglobin: 12.7 g/dL (ref 11.1–15.9)
Immature Grans (Abs): 0 10*3/uL (ref 0.0–0.1)
Immature Granulocytes: 0 %
Lymphocytes Absolute: 2 10*3/uL (ref 0.7–3.1)
Lymphs: 40 %
MCH: 28.3 pg (ref 26.6–33.0)
MCHC: 31.7 g/dL (ref 31.5–35.7)
MCV: 90 fL (ref 79–97)
Monocytes Absolute: 0.4 10*3/uL (ref 0.1–0.9)
Monocytes: 9 %
Neutrophils Absolute: 2.2 10*3/uL (ref 1.4–7.0)
Neutrophils: 45 %
Platelets: 289 10*3/uL (ref 150–450)
RBC: 4.48 x10E6/uL (ref 3.77–5.28)
RDW: 13.1 % (ref 11.7–15.4)
WBC: 4.9 10*3/uL (ref 3.4–10.8)

## 2023-05-26 LAB — COMPREHENSIVE METABOLIC PANEL
ALT: 9 IU/L (ref 0–32)
AST: 13 IU/L (ref 0–40)
Albumin: 4.2 g/dL (ref 4.0–5.0)
Alkaline Phosphatase: 68 IU/L (ref 44–121)
BUN/Creatinine Ratio: 14 (ref 9–23)
BUN: 11 mg/dL (ref 6–20)
Bilirubin Total: 0.4 mg/dL (ref 0.0–1.2)
CO2: 23 mmol/L (ref 20–29)
Calcium: 9.1 mg/dL (ref 8.7–10.2)
Chloride: 101 mmol/L (ref 96–106)
Creatinine, Ser: 0.8 mg/dL (ref 0.57–1.00)
Globulin, Total: 2.8 g/dL (ref 1.5–4.5)
Glucose: 92 mg/dL (ref 70–99)
Potassium: 4.6 mmol/L (ref 3.5–5.2)
Sodium: 137 mmol/L (ref 134–144)
Total Protein: 7 g/dL (ref 6.0–8.5)
eGFR: 103 mL/min/{1.73_m2} (ref >59)

## 2023-05-26 LAB — LIPID PANEL
Chol/HDL Ratio: 3.2 ratio (ref 0.0–4.4)
Cholesterol, Total: 180 mg/dL (ref 100–199)
HDL: 56 mg/dL (ref >39)
LDL Chol Calc (NIH): 108 mg/dL — ABNORMAL HIGH (ref 0–99)
Triglycerides: 88 mg/dL (ref 0–149)
VLDL Cholesterol Cal: 16 mg/dL (ref 5–40)

## 2023-05-26 LAB — IRON,TIBC AND FERRITIN PANEL
Ferritin: 80 ng/mL (ref 15–150)
Iron Saturation: 20 % (ref 15–55)
Iron: 61 ug/dL (ref 27–159)
Total Iron Binding Capacity: 303 ug/dL (ref 250–450)
UIBC: 242 ug/dL (ref 131–425)

## 2023-05-26 LAB — TSH+FREE T4
Free T4: 1 ng/dL (ref 0.82–1.77)
TSH: 3.51 u[IU]/mL (ref 0.450–4.500)

## 2023-05-26 LAB — VITAMIN B12: Vitamin B-12: 229 pg/mL — ABNORMAL LOW (ref 232–1245)

## 2023-08-14 ENCOUNTER — Other Ambulatory Visit: Payer: Self-pay

## 2023-08-14 ENCOUNTER — Emergency Department
Admission: EM | Admit: 2023-08-14 | Discharge: 2023-08-14 | Disposition: A | Discharge status: Home / Self Care | Payer: Medicaid Other | Attending: Emergency Medicine | Admitting: Emergency Medicine

## 2023-08-14 ENCOUNTER — Emergency Department: Payer: Medicaid Other

## 2023-08-14 DIAGNOSIS — Z3490 Encounter for supervision of normal pregnancy, unspecified, unspecified trimester: Secondary | ICD-10-CM

## 2023-08-14 DIAGNOSIS — N83202 Unspecified ovarian cyst, left side: Secondary | ICD-10-CM | POA: Insufficient documentation

## 2023-08-14 DIAGNOSIS — Z3491 Encounter for supervision of normal pregnancy, unspecified, first trimester: Secondary | ICD-10-CM | POA: Insufficient documentation

## 2023-08-14 LAB — URINALYSIS, ROUTINE W REFLEX MICROSCOPIC
Bilirubin Urine: NEGATIVE
Glucose, UA: NEGATIVE mg/dL
Hgb urine dipstick: NEGATIVE
Ketones, ur: 80 mg/dL — AB
Nitrite: NEGATIVE
Protein, ur: NEGATIVE mg/dL
Specific Gravity, Urine: 1.016 (ref 1.005–1.030)
pH: 6 (ref 5.0–8.0)

## 2023-08-14 LAB — CBC
HCT: 36.7 % (ref 36.0–46.0)
Hemoglobin: 12.5 g/dL (ref 12.0–15.0)
MCH: 29.6 pg (ref 26.0–34.0)
MCHC: 34.1 g/dL (ref 30.0–36.0)
MCV: 86.8 fL (ref 80.0–100.0)
Platelets: 304 10*3/uL (ref 150–400)
RBC: 4.23 MIL/uL (ref 3.87–5.11)
RDW: 13.3 % (ref 11.5–15.5)
WBC: 5.4 10*3/uL (ref 4.0–10.5)
nRBC: 0 % (ref 0.0–0.2)

## 2023-08-14 LAB — BASIC METABOLIC PANEL
Anion gap: 10 (ref 5–15)
BUN: 7 mg/dL (ref 6–20)
CO2: 19 mmol/L — ABNORMAL LOW (ref 22–32)
Calcium: 8.9 mg/dL (ref 8.9–10.3)
Chloride: 101 mmol/L (ref 98–111)
Creatinine, Ser: 0.69 mg/dL (ref 0.44–1.00)
GFR, Estimated: >60 mL/min (ref >60)
Glucose, Bld: 91 mg/dL (ref 70–99)
Potassium: 4 mmol/L (ref 3.5–5.1)
Sodium: 130 mmol/L — ABNORMAL LOW (ref 135–145)

## 2023-08-14 LAB — POC URINE PREG, ED: Preg Test, Ur: POSITIVE — AB

## 2023-08-14 LAB — WET PREP, GENITAL
Clue Cells Wet Prep HPF POC: NONE SEEN
Sperm: NONE SEEN
Trich, Wet Prep: NONE SEEN
WBC, Wet Prep HPF POC: <10 (ref <10)
Yeast Wet Prep HPF POC: NONE SEEN

## 2023-08-14 LAB — CHLAMYDIA/NGC RT PCR (ARMC ONLY)
Chlamydia Tr: NOT DETECTED
N gonorrhoeae: NOT DETECTED

## 2023-08-14 LAB — HCG, QUANTITATIVE, PREGNANCY: hCG, Beta Chain, Quant, S: 33622 m[IU]/mL — ABNORMAL HIGH (ref <5)

## 2023-08-14 NOTE — ED Triage Notes (Signed)
Pt reports has been feeling nauseous this week, having some lower abd discomfort. Took pregnancy test today and saw 2 lines but states still unsure if pregnant.

## 2023-08-14 NOTE — ED Provider Notes (Signed)
[facility] Provider Note    Event Date/Time   First MD Initiated Contact with Patient 08/14/23 1352     (approximate)   History   positive pregnancy test   HPI  Morgan Young is a 27 y.o. female G1 P1 who presents to the emergency department with abdominal pain.  Patient states that she has been feeling nauseous for the past 1 week.  Left-sided abdominal pain that states has worsened today.  Sharp stabbing pain.  Denies any burning with urination or urinary urgency or frequency.  No history of kidney stone.  Does endorse home pregnancy test that she was concerned was positive that she took just prior to arrival.  No vaginal bleeding.  LMP was 2 weeks ago.     Physical Exam   Triage Vital Signs: ED Triage Vitals  Encounter Vitals Group     BP 08/14/23 1300 125/64     Systolic BP Percentile --      Diastolic BP Percentile --      Pulse Rate 08/14/23 1300 82     Resp 08/14/23 1258 18     Temp 08/14/23 1258 98.8 F (37.1 C)     Temp src --      SpO2 08/14/23 1300 97 %     Weight 08/14/23 1259 210 lb (95.3 kg)     Height 08/14/23 1259 5\' 6"  (1.676 m)     Head Circumference --      Peak Flow --      Pain Score 08/14/23 1258 9     Pain Loc --      Pain Education --      Exclude from Growth Chart --     Most recent vital signs: Vitals:   08/14/23 1258 08/14/23 1300  BP:  125/64  Pulse:  82  Resp: 18   Temp: 98.8 F (37.1 C)   SpO2:  97%    Physical Exam Constitutional:      Appearance: She is well-developed.  HENT:     Head: Atraumatic.  Eyes:     Conjunctiva/sclera: Conjunctivae normal.  Cardiovascular:     Rate and Rhythm: Regular rhythm.  Pulmonary:     Effort: No respiratory distress.  Abdominal:     General: There is no distension.     Tenderness: There is abdominal tenderness (Left lower quadrant).  Genitourinary:    Comments: Friable cervix, closed os. No CMT.  Musculoskeletal:        General: Normal range of motion.      Cervical back: Normal range of motion.  Skin:    General: Skin is warm.     Capillary Refill: Capillary refill takes less than 2 seconds.  Neurological:     Mental Status: She is alert. Mental status is at baseline.      IMPRESSION / MDM / ASSESSMENT AND PLAN / ED COURSE  I reviewed the triage vital signs and the nursing notes.  Differential diagnosis including pregnancy, ectopic pregnancy, kidney stone, pyelonephritis    Labs (all labs ordered are listed, but only abnormal results are displayed) Labs interpreted as -    Labs Reviewed  URINALYSIS, ROUTINE W REFLEX MICROSCOPIC - Abnormal; Notable for the following components:      Result Value   Color, Urine YELLOW (*)    APPearance CLEAR (*)    Ketones, ur 80 (*)    Leukocytes,Ua TRACE (*)    Bacteria, UA RARE (*)    All other components within  normal limits  POC URINE PREG, ED - Abnormal; Notable for the following components:   Preg Test, Ur POSITIVE (*)    All other components within normal limits  CHLAMYDIA/NGC RT PCR ([facility] ONLY)            WET PREP, GENITAL  URINE CULTURE  CBC  HCG, QUANTITATIVE, PREGNANCY  BASIC METABOLIC PANEL    Pregnancy test is positive.  Obtaining a quant.  UA with rare bacteria with leukocyte esterase but no WBCs, have a low suspicion for cystitis or pyelonephritis.  No RBCs have low suspicion for kidney stone.  No vaginal bleeding.  Ultrasound obtained to evaluate for possible ectopic pregnancy versus intrauterine pregnancy.  Care transferred to incoming provider.     PROCEDURES:  Critical Care performed: No  Procedures  Patient's presentation is most consistent with acute presentation with potential threat to life or bodily function.   MEDICATIONS ORDERED IN ED: Medications - No data to display  FINAL CLINICAL IMPRESSION(S) / ED DIAGNOSES   Final diagnoses:  Pregnancy, unspecified gestational age     Rx / DC Orders   ED Discharge Orders     None        Note:   This document was prepared using Dragon voice recognition software and may include unintentional dictation errors.   Corena Herter, MD 08/14/23 [phone]

## 2023-08-14 NOTE — ED Notes (Signed)
See triage note  Presents with some abd cramping which started this am  States the cramping is getting worse  Unable to sit still at present  Some nausea

## 2023-08-14 NOTE — ED Notes (Addendum)
Patient came out to check on status. Patient was informed by Dr. Cyril Loosen that he is waiting on ultrasound report. Patient had a steady gait. NAD.

## 2023-08-14 NOTE — ED Notes (Signed)
Patient is in ultrasound.

## 2023-08-16 LAB — URINE CULTURE: Culture: <10000 — AB

## 2023-08-19 ENCOUNTER — Other Ambulatory Visit: Payer: Self-pay

## 2023-08-19 DIAGNOSIS — O3680X0 Pregnancy with inconclusive fetal viability, not applicable or unspecified: Secondary | ICD-10-CM

## 2023-08-19 NOTE — Progress Notes (Signed)
Patient seen in ED on 9/20 U/S Showed [redacted]w[redacted]d Single IUP with low heart tones 77 bpm. Recommended close ineterval f/u. F/U Viability ordered.

## 2023-08-20 ENCOUNTER — Ambulatory Visit: Payer: Medicaid Other

## 2023-08-20 ENCOUNTER — Telehealth: Payer: Self-pay | Admitting: Obstetrics and Gynecology

## 2023-08-20 VITALS — Wt 214.0 lb

## 2023-08-20 DIAGNOSIS — Z3689 Encounter for other specified antenatal screening: Secondary | ICD-10-CM

## 2023-08-20 DIAGNOSIS — Z369 Encounter for antenatal screening, unspecified: Secondary | ICD-10-CM

## 2023-08-20 DIAGNOSIS — Z3480 Encounter for supervision of other normal pregnancy, unspecified trimester: Secondary | ICD-10-CM | POA: Insufficient documentation

## 2023-08-20 NOTE — Progress Notes (Signed)
New OB Intake  I connected with  Sidonie Dickens on 08/20/23 at 11:15 AM EDT by telephone Video Visit and verified that I am speaking with the correct person using two identifiers. Nurse is located at [facility] and pt is located at home.  I explained I am completing New OB Intake today. We discussed her EDD of 04/11/2024 that is based on LMP of 07/06/2023. Pt is G2/P0101. I reviewed her allergies, medications, Medical/Surgical/OB history, and appropriate screenings. There are no cats in the home.  Based on history, this is a/an pregnancy uncomplicated .   Patient Active Problem List   Diagnosis Date Noted   Supervision of other normal pregnancy, antepartum 08/20/2023   Depression, major, single episode, mild (HCC) 05/25/2023   Other fatigue 05/25/2023   Obesity (BMI 30-39.9) 05/21/2022   Seasonal allergies 05/21/2022   Nut allergy 05/21/2022   History of self-harm 07/16/2017    Concerns addressed today On two previous u/s FHR is very low; will get another u/s for f/u.  Delivery Plans:  Plans to deliver at [facility].  Anatomy US Explained first scheduled [place] will be scheduled soon and an anatomy scan will be done at 20 weeks.  Labs Discussed genetic screening with patient. Patient desires genetic testing to be drawn at new OB visit. Discussed possible labs to be drawn at new OB appointment.  COVID Vaccine Patient has not had COVID vaccine.   Social Determinants of Health Food Insecurity: denies food insecurity Transportation: Patient denies transportation needs. Childcare: Discussed no children allowed at ultrasound appointments.   First visit review I reviewed new OB appt with pt. I explained she will have ob bloodwork and pap smear/pelvic exam if indicated. Explained pt will be seen by an AOB Provider at first visit; encounter routed to appropriate provider.   Loran Senters, [facility] 08/20/2023  12:07 PM

## 2023-08-20 NOTE — Progress Notes (Signed)
Reached out to pt to get scan scheduled.  Had a to leave a message for pt to call back.

## 2023-08-20 NOTE — Telephone Encounter (Signed)
Reached out to pt to get dating/viability scan scheduled (per NOB Intake Nurse).  Left message for pt to call back to schedule.

## 2023-08-20 NOTE — Telephone Encounter (Signed)
The patient called back and confirmed she is scheduled for 9/27 at [facility] at 11:00. The patient will call aback to scheduled NOB phys

## 2023-08-20 NOTE — Patient Instructions (Signed)
First Trimester of Pregnancy  The first trimester of pregnancy starts on the first day of your last menstrual period until the end of week 12. This is also called months 1 through 3 of pregnancy. Body changes during your first trimester Your body goes through many changes during pregnancy. The changes usually return to normal after your baby is born. Physical changes You may gain or lose weight. Your breasts may grow larger and hurt. The area around your nipples may get darker. Dark spots or blotches may develop on your face. You may have changes in your hair. Health changes You may feel like you might vomit (nauseous), and you may vomit. You may have heartburn. You may have headaches. You may have trouble pooping (constipation). Your gums may bleed. Other changes You may get tired easily. You may pee (urinate) more often. Your menstrual periods will stop. You may not feel hungry. You may want to eat certain kinds of food. You may have changes in your emotions from day to day. You may have more dreams. Follow these instructions at home: Medicines Take over-the-counter and prescription medicines only as told by your doctor. Some medicines are not safe during pregnancy. Take a prenatal vitamin that contains at least 600 micrograms (mcg) of folic acid. Eating and drinking Eat healthy meals that include: Fresh fruits and vegetables. Whole grains. Good sources of protein, such as meat, eggs, or tofu. Low-fat dairy products. Avoid raw meat and unpasteurized juice, milk, and cheese. If you feel like you may vomit, or you vomit: Eat 4 or 5 small meals a day instead of 3 large meals. Try eating a few soda crackers. Drink liquids between meals instead of during meals. You may need to take these actions to prevent or treat trouble pooping: Drink enough fluids to keep your pee (urine) pale yellow. Eat foods that are high in fiber. These include beans, whole grains, and fresh fruits and  vegetables. Limit foods that are high in fat and sugar. These include fried or sweet foods. Activity Exercise only as told by your doctor. Most people can do their usual exercise routine during pregnancy. Stop exercising if you have cramps or pain in your lower belly (abdomen) or low back. Do not exercise if it is too hot or too humid, or if you are in a place of great height (high altitude). Avoid heavy lifting. If you choose to, you may have sex unless your doctor tells you not to. Relieving pain and discomfort Wear a good support bra if your breasts are sore. Rest with your legs raised (elevated) if you have leg cramps or low back pain. If you have bulging veins (varicose veins) in your legs: Wear support hose as told by your doctor. Raise your feet for 15 minutes, 3-4 times a day. Limit salt in your food. Safety Wear your seat belt at all times when you are in a car. Talk with your doctor if someone is hurting you or yelling at you. Talk with your doctor if you are feeling sad or have thoughts of hurting yourself. Lifestyle Do not use hot tubs, steam rooms, or saunas. Do not douche. Do not use tampons or scented sanitary pads. Do not use herbal medicines, illegal drugs, or medicines that are not approved by your doctor. Do not drink alcohol. Do not smoke or use any products that contain nicotine or tobacco. If you need help quitting, ask your doctor. Avoid cat litter boxes and soil that is used by cats. These carry   germs that can cause harm to the baby and can cause a loss of your baby by miscarriage or stillbirth. General instructions Keep all follow-up visits. This is important. Ask for help if you need counseling or if you need help with nutrition. Your doctor can give you advice or tell you where to go for help. Visit your dentist. At home, brush your teeth with a soft toothbrush. Floss gently. Write down your questions. Take them to your prenatal visits. Where to find more  information American Pregnancy Association: americanpregnancy.org American College of Obstetricians and Gynecologists: www.acog.org Office on Women's Health: womenshealth.gov/pregnancy Contact a doctor if: You are dizzy. You have a fever. You have mild cramps or pressure in your lower belly. You have a nagging pain in your belly area. You continue to feel like you may vomit, you vomit, or you have watery poop (diarrhea) for 24 hours or longer. You have a bad-smelling fluid coming from your vagina. You have pain when you pee. You are exposed to a disease that spreads from person to person, such as chickenpox, measles, Zika virus, HIV, or hepatitis. Get help right away if: You have spotting or bleeding from your vagina. You have very bad belly cramping or pain. You have shortness of breath or chest pain. You have any kind of injury, such as from a fall or a car crash. You have new or increased pain, swelling, or redness in an arm or leg. Summary The first trimester of pregnancy starts on the first day of your last menstrual period until the end of week 12 (months 1 through 3). Eat 4 or 5 small meals a day instead of 3 large meals. Do not smoke or use any products that contain nicotine or tobacco. If you need help quitting, ask your doctor. Keep all follow-up visits. This information is not intended to replace advice given to you by your health care provider. Make sure you discuss any questions you have with your health care provider. Document Revised: 04/18/2020 Document Reviewed: 02/23/2020 Elsevier Patient Education  2024 Elsevier Inc. Commonly Asked Questions During Pregnancy  Cats: A parasite can be excreted in cat feces.  To avoid exposure you need to have another person empty the little box.  If you must empty the litter box you will need to wear gloves.  Wash your hands after handling your cat.  This parasite can also be found in raw or undercooked meat so this should also be  avoided.  Colds, Sore Throats, Flu: Please check your medication sheet to see what you can take for symptoms.  If your symptoms are unrelieved by these medications please call the office.  Dental Work: Most any dental work your dentist recommends is permitted.  X-rays should only be taken during the first trimester if absolutely necessary.  Your abdomen should be shielded with a lead apron during all x-rays.  Please notify your provider prior to receiving any x-rays.  Novocaine is fine; gas is not recommended.  If your dentist requires a note from us prior to dental work please call the office and we will provide one for you.  Exercise: Exercise is an important part of staying healthy during your pregnancy.  You may continue most exercises you were accustomed to prior to pregnancy.  Later in your pregnancy you will most likely notice you have difficulty with activities requiring balance like riding a bicycle.  It is important that you listen to your body and avoid activities that put you at a higher   risk of falling.  Adequate rest and staying well hydrated are a must!  If you have questions about the safety of specific activities ask your provider.    Exposure to Children with illness: Try to avoid obvious exposure; report any symptoms to us when noted,  If you have chicken pos, red measles or mumps, you should be immune to these diseases.   Please do not take any vaccines while pregnant unless you have checked with your OB provider.  Fetal Movement: After 28 weeks we recommend you do "kick counts" twice daily.  Lie or sit down in a calm quiet environment and count your baby movements "kicks".  You should feel your baby at least 10 times per hour.  If you have not felt 10 kicks within the first hour get up, walk around and have something sweet to eat or drink then repeat for an additional hour.  If count remains less than 10 per hour notify your provider.  Fumigating: Follow your pest control agent's  advice as to how long to stay out of your home.  Ventilate the area well before re-entering.  Hemorrhoids:   Most over-the-counter preparations can be used during pregnancy.  Check your medication to see what is safe to use.  It is important to use a stool softener or fiber in your diet and to drink lots of liquids.  If hemorrhoids seem to be getting worse please call the office.   Hot Tubs:  Hot tubs Jacuzzis and saunas are not recommended while pregnant.  These increase your internal body temperature and should be avoided.  Intercourse:  Sexual intercourse is safe during pregnancy as long as you are comfortable, unless otherwise advised by your provider.  Spotting may occur after intercourse; report any bright red bleeding that is heavier than spotting.  Labor:  If you know that you are in labor, please go to the hospital.  If you are unsure, please call the office and let us help you decide what to do.  Lifting, straining, etc:  If your job requires heavy lifting or straining please check with your provider for any limitations.  Generally, you should not lift items heavier than that you can lift simply with your hands and arms (no back muscles)  Painting:  Paint fumes do not harm your pregnancy, but may make you ill and should be avoided if possible.  Latex or water based paints have less odor than oils.  Use adequate ventilation while painting.  Permanents & Hair Color:  Chemicals in hair dyes are not recommended as they cause increase hair dryness which can increase hair loss during pregnancy.  " Highlighting" and permanents are allowed.  Dye may be absorbed differently and permanents may not hold as well during pregnancy.  Sunbathing:  Use a sunscreen, as skin burns easily during pregnancy.  Drink plenty of fluids; avoid over heating.  Tanning Beds:  Because their possible side effects are still unknown, tanning beds are not recommended.  Ultrasound Scans:  Routine ultrasounds are performed  at approximately 20 weeks.  You will be able to see your baby's general anatomy an if you would like to know the gender this can usually be determined as well.  If it is questionable when you conceived you may also receive an ultrasound early in your pregnancy for dating purposes.  Otherwise ultrasound exams are not routinely performed unless there is a medical necessity.  Although you can request a scan we ask that you pay for it when   conducted because insurance does not cover " patient request" scans.  Work: If your pregnancy proceeds without complications you may work until your due date, unless your physician or employer advises otherwise.  Round Ligament Pain/Pelvic Discomfort:  Sharp, shooting pains not associated with bleeding are fairly common, usually occurring in the second trimester of pregnancy.  They tend to be worse when standing up or when you remain standing for long periods of time.  These are the result of pressure of certain pelvic ligaments called "round ligaments".  Rest, Tylenol and heat seem to be the most effective relief.  As the womb and fetus grow, they rise out of the pelvis and the discomfort improves.  Please notify the office if your pain seems different than that described.  It may represent a more serious condition.  Common Medications Safe in Pregnancy  Acne:      Constipation:  Benzoyl Peroxide     Colace  Clindamycin      Dulcolax Suppository  Topica Erythromycin     Fibercon  Salicylic Acid      Metamucil         Miralax AVOID:        Senakot   Accutane    Cough:  Retin-A       Cough Drops  Tetracycline      Phenergan w/ Codeine if Rx  Minocycline      Robitussin (Plain & DM)  Antibiotics:     Crabs/Lice:  Ceclor       RID  Cephalosporins    AVOID:  E-Mycins      Kwell  Keflex  Macrobid/Macrodantin   Diarrhea:  Penicillin      Kao-Pectate  Zithromax      Imodium AD         PUSH FLUIDS AVOID:       Cipro     Fever:  Tetracycline      Tylenol (Regular  or Extra  Minocycline       Strength)  Levaquin      Extra Strength-Do not          Exceed 8 tabs/24 hrs Caffeine:        <200mg/day (equiv. To 1 cup of coffee or  approx. 3 12 oz sodas)         Gas: Cold/Hayfever:       Gas-X  Benadryl      Mylicon  Claritin       Phazyme  **Claritin-D        Chlor-Trimeton    Headaches:  Dimetapp      ASA-Free Excedrin  Drixoral-Non-Drowsy     Cold Compress  Mucinex (Guaifenasin)     Tylenol (Regular or Extra  Sudafed/Sudafed-12 Hour     Strength)  **Sudafed PE Pseudoephedrine   Tylenol Cold & Sinus     Vicks Vapor Rub  Zyrtec  **AVOID if Problems With Blood Pressure         Heartburn: Avoid lying down for at least 1 hour after meals  Aciphex      Maalox     Rash:  Milk of Magnesia     Benadryl    Mylanta       1% Hydrocortisone Cream  Pepcid  Pepcid Complete   Sleep Aids:  Prevacid      Ambien   Prilosec       Benadryl  Rolaids       Chamomile Tea  Tums (Limit 4/day)     Unisom           Tylenol PM         Warm milk-add vanilla or  Hemorrhoids:       Sugar for taste  Anusol/Anusol H.C.  (RX: Analapram 2.5%)  Sugar Substitutes:  Hydrocortisone OTC     Ok in moderation  Preparation H      Tucks        Vaseline lotion applied to tissue with wiping    Herpes:     Throat:  Acyclovir      Oragel  Famvir  Valtrex     Vaccines:         Flu Shot Leg Cramps:       *Gardasil  Benadryl      Hepatitis A         Hepatitis B Nasal Spray:       Pneumovax  Saline Nasal Spray     Polio Booster         Tetanus Nausea:       Tuberculosis test or PPD  Vitamin B6 25 mg TID   AVOID:    Dramamine      *Gardasil  Emetrol       Live Poliovirus  Ginger Root 250 mg QID    MMR (measles, mumps &  High Complex Carbs @ Bedtime    rebella)  Sea Bands-Accupressure    Varicella (Chickenpox)  Unisom 1/2 tab TID     *No known complications           If received before Pain:         Known pregnancy;   Darvocet       Resume series  after  Lortab        Delivery  Percocet    Yeast:   Tramadol      Femstat  Tylenol 3      Gyne-lotrimin  Ultram       Monistat  Vicodin           MISC:         All Sunscreens           Hair Coloring/highlights          Insect Repellant's          (Including DEET)         Mystic Tans  

## 2023-08-21 ENCOUNTER — Ambulatory Visit
Admission: RE | Admit: 2023-08-21 | Discharge: 2023-08-21 | Disposition: A | Discharge status: Home / Self Care | Payer: Medicaid Other | Source: Ambulatory Visit | Attending: Obstetrics and Gynecology | Admitting: Obstetrics and Gynecology

## 2023-08-21 ENCOUNTER — Other Ambulatory Visit: Payer: Self-pay | Admitting: Obstetrics and Gynecology

## 2023-08-21 DIAGNOSIS — O3680X0 Pregnancy with inconclusive fetal viability, not applicable or unspecified: Secondary | ICD-10-CM

## 2023-08-31 ENCOUNTER — Other Ambulatory Visit: Payer: Self-pay

## 2023-08-31 ENCOUNTER — Encounter: Payer: Self-pay | Admitting: Emergency Medicine

## 2023-08-31 ENCOUNTER — Emergency Department: Payer: Medicaid Other | Admitting: Certified Registered"

## 2023-08-31 ENCOUNTER — Ambulatory Visit
Admission: EM | Admit: 2023-08-31 | Discharge: 2023-08-31 | Disposition: A | Discharge status: Home / Self Care | Payer: Medicaid Other | Attending: Emergency Medicine | Admitting: Emergency Medicine

## 2023-08-31 ENCOUNTER — Encounter
Admission: EM | Disposition: A | Discharge status: Home / Self Care | Payer: Self-pay | Source: Home / Self Care | Attending: Emergency Medicine

## 2023-08-31 ENCOUNTER — Emergency Department: Payer: Medicaid Other

## 2023-08-31 DIAGNOSIS — Z3A08 8 weeks gestation of pregnancy: Secondary | ICD-10-CM | POA: Insufficient documentation

## 2023-08-31 DIAGNOSIS — N83202 Unspecified ovarian cyst, left side: Secondary | ICD-10-CM

## 2023-08-31 DIAGNOSIS — N83512 Torsion of left ovary and ovarian pedicle: Secondary | ICD-10-CM | POA: Insufficient documentation

## 2023-08-31 DIAGNOSIS — O3481 Maternal care for other abnormalities of pelvic organs, first trimester: Secondary | ICD-10-CM | POA: Diagnosis not present

## 2023-08-31 DIAGNOSIS — Z87891 Personal history of nicotine dependence: Secondary | ICD-10-CM | POA: Insufficient documentation

## 2023-08-31 DIAGNOSIS — R102 Pelvic and perineal pain: Secondary | ICD-10-CM | POA: Insufficient documentation

## 2023-08-31 DIAGNOSIS — N83292 Other ovarian cyst, left side: Secondary | ICD-10-CM | POA: Insufficient documentation

## 2023-08-31 HISTORY — PX: LAPAROSCOPIC OVARIAN CYSTECTOMY: SHX6248

## 2023-08-31 LAB — LIPASE, BLOOD: Lipase: 23 U/L (ref 11–51)

## 2023-08-31 LAB — COMPREHENSIVE METABOLIC PANEL
ALT: 10 U/L (ref 0–44)
AST: 12 U/L — ABNORMAL LOW (ref 15–41)
Albumin: 3.4 g/dL — ABNORMAL LOW (ref 3.5–5.0)
Alkaline Phosphatase: 45 U/L (ref 38–126)
Anion gap: 7 (ref 5–15)
BUN: 11 mg/dL (ref 6–20)
CO2: 19 mmol/L — ABNORMAL LOW (ref 22–32)
Calcium: 8.3 mg/dL — ABNORMAL LOW (ref 8.9–10.3)
Chloride: 106 mmol/L (ref 98–111)
Creatinine, Ser: 0.66 mg/dL (ref 0.44–1.00)
GFR, Estimated: >60 mL/min (ref >60)
Glucose, Bld: 118 mg/dL — ABNORMAL HIGH (ref 70–99)
Potassium: 3.6 mmol/L (ref 3.5–5.1)
Sodium: 132 mmol/L — ABNORMAL LOW (ref 135–145)
Total Bilirubin: 0.6 mg/dL (ref 0.3–1.2)
Total Protein: 7.1 g/dL (ref 6.5–8.1)

## 2023-08-31 LAB — CBC
HCT: 33 % — ABNORMAL LOW (ref 36.0–46.0)
Hemoglobin: 11.3 g/dL — ABNORMAL LOW (ref 12.0–15.0)
MCH: 29.7 pg (ref 26.0–34.0)
MCHC: 34.2 g/dL (ref 30.0–36.0)
MCV: 86.8 fL (ref 80.0–100.0)
Platelets: 268 10*3/uL (ref 150–400)
RBC: 3.8 MIL/uL — ABNORMAL LOW (ref 3.87–5.11)
RDW: 13.2 % (ref 11.5–15.5)
WBC: 6.2 10*3/uL (ref 4.0–10.5)
nRBC: 0 % (ref 0.0–0.2)

## 2023-08-31 LAB — TYPE AND SCREEN
ABO/RH(D): A POS
Antibody Screen: NEGATIVE

## 2023-08-31 LAB — HCG, QUANTITATIVE, PREGNANCY: hCG, Beta Chain, Quant, S: 156327 m[IU]/mL — ABNORMAL HIGH (ref <5)

## 2023-08-31 LAB — PROTIME-INR
INR: 1.1 (ref 0.8–1.2)
Prothrombin Time: 14 s (ref 11.4–15.2)

## 2023-08-31 SURGERY — EXCISION, CYST, OVARY, LAPAROSCOPIC
Anesthesia: General | Site: Abdomen | Laterality: Left

## 2023-08-31 MED ORDER — DEXAMETHASONE SODIUM PHOSPHATE 10 MG/ML IJ SOLN
INTRAMUSCULAR | Status: AC
  Filled 2023-08-31: qty 1

## 2023-08-31 MED ORDER — FENTANYL CITRATE (PF) 100 MCG/2ML IJ SOLN
INTRAMUSCULAR | Status: AC
  Filled 2023-08-31: qty 2

## 2023-08-31 MED ORDER — BUPIVACAINE HCL 0.5 % IJ SOLN
INTRAMUSCULAR | Status: DC | PRN
  Administered 2023-08-31: 10 mL

## 2023-08-31 MED ORDER — ONDANSETRON HCL 4 MG/2ML IJ SOLN
INTRAMUSCULAR | Status: DC | PRN
  Administered 2023-08-31: 4 mg via INTRAVENOUS

## 2023-08-31 MED ORDER — ROCURONIUM BROMIDE 100 MG/10ML IV SOLN
INTRAVENOUS | Status: DC | PRN
  Administered 2023-08-31: 30 mg via INTRAVENOUS
  Administered 2023-08-31: 10 mg via INTRAVENOUS

## 2023-08-31 MED ORDER — ATROPINE SULFATE 0.4 MG/ML IV SOLN
INTRAVENOUS | Status: AC
  Filled 2023-08-31: qty 1

## 2023-08-31 MED ORDER — POVIDONE-IODINE 10 % EX SWAB
2.0000 | Freq: Once | CUTANEOUS | Status: DC
  Filled 2023-08-31: qty 4

## 2023-08-31 MED ORDER — ROCURONIUM BROMIDE 10 MG/ML (PF) SYRINGE
PREFILLED_SYRINGE | INTRAVENOUS | Status: AC
  Filled 2023-08-31: qty 10

## 2023-08-31 MED ORDER — ACETAMINOPHEN 500 MG PO TABS
ORAL_TABLET | ORAL | Status: AC
  Filled 2023-08-31: qty 2

## 2023-08-31 MED ORDER — OXYCODONE HCL 5 MG PO TABS
ORAL_TABLET | ORAL | Status: AC
  Filled 2023-08-31: qty 1

## 2023-08-31 MED ORDER — SUCCINYLCHOLINE CHLORIDE 200 MG/10ML IV SOSY
PREFILLED_SYRINGE | INTRAVENOUS | Status: AC
  Filled 2023-08-31: qty 10

## 2023-08-31 MED ORDER — ACETAMINOPHEN EXTRA STRENGTH 500 MG PO TABS: 1000.0000 mg | ORAL_TABLET | Freq: Four times a day (QID) | ORAL | 0 refills | Status: AC

## 2023-08-31 MED ORDER — SUCCINYLCHOLINE CHLORIDE 200 MG/10ML IV SOSY
PREFILLED_SYRINGE | INTRAVENOUS | Status: DC | PRN
  Administered 2023-08-31: 100 mg via INTRAVENOUS

## 2023-08-31 MED ORDER — SODIUM CHLORIDE 0.9 % IR SOLN
Status: DC | PRN
Start: 2023-08-31 — End: 2023-08-31
  Administered 2023-08-31: 1

## 2023-08-31 MED ORDER — LACTATED RINGERS IV SOLN: INTRAVENOUS | Status: DC | PRN

## 2023-08-31 MED ORDER — PROGESTERONE 200 MG PO CAPS: ORAL_CAPSULE | ORAL | 3 refills | Status: DC

## 2023-08-31 MED ORDER — OXYCODONE HCL 5 MG PO TABS
5.0000 mg | ORAL_TABLET | ORAL | 0 refills | Status: DC | PRN
Start: 2023-08-31 — End: 2023-09-25

## 2023-08-31 MED ORDER — MORPHINE SULFATE (PF) 4 MG/ML IV SOLN
4.0000 mg | Freq: Once | INTRAVENOUS | Status: AC
  Administered 2023-08-31: 4 mg via INTRAVENOUS
  Filled 2023-08-31: qty 1

## 2023-08-31 MED ORDER — PROPOFOL 10 MG/ML IV BOLUS
INTRAVENOUS | Status: AC
  Filled 2023-08-31: qty 20

## 2023-08-31 MED ORDER — SODIUM CHLORIDE 0.9 % IV SOLN: INTRAVENOUS | Status: DC

## 2023-08-31 MED ORDER — OXYCODONE HCL 5 MG PO TABS
5.0000 mg | ORAL_TABLET | ORAL | Status: AC
  Administered 2023-08-31: 5 mg via ORAL

## 2023-08-31 MED ORDER — DOCUSATE SODIUM 100 MG PO CAPS: 100.0000 mg | ORAL_CAPSULE | Freq: Two times a day (BID) | ORAL | 0 refills | Status: DC

## 2023-08-31 MED ORDER — PROPOFOL 10 MG/ML IV BOLUS
INTRAVENOUS | Status: DC | PRN
  Administered 2023-08-31: 150 mg via INTRAVENOUS

## 2023-08-31 MED ORDER — LIDOCAINE HCL (PF) 2 % IJ SOLN
INTRAMUSCULAR | Status: AC
  Filled 2023-08-31: qty 5

## 2023-08-31 MED ORDER — HEMOSTATIC AGENTS (NO CHARGE) OPTIME
TOPICAL | Status: DC | PRN
Start: 2023-08-31 — End: 2023-08-31
  Administered 2023-08-31: 1 via TOPICAL

## 2023-08-31 MED ORDER — HYDROMORPHONE HCL 1 MG/ML IJ SOLN
INTRAMUSCULAR | Status: AC
  Filled 2023-08-31: qty 1

## 2023-08-31 MED ORDER — FENTANYL CITRATE (PF) 100 MCG/2ML IJ SOLN
INTRAMUSCULAR | Status: DC | PRN
  Administered 2023-08-31: 50 ug via INTRAVENOUS
  Administered 2023-08-31 (×2): 25 ug via INTRAVENOUS

## 2023-08-31 MED ORDER — ONDANSETRON HCL 4 MG/2ML IJ SOLN
4.0000 mg | Freq: Once | INTRAMUSCULAR | Status: AC
  Administered 2023-08-31: 4 mg via INTRAVENOUS
  Filled 2023-08-31: qty 2

## 2023-08-31 MED ORDER — DEXAMETHASONE SODIUM PHOSPHATE 10 MG/ML IJ SOLN
INTRAMUSCULAR | Status: DC | PRN
  Administered 2023-08-31: 5 mg via INTRAVENOUS

## 2023-08-31 MED ORDER — NEOSTIGMINE METHYLSULFATE 10 MG/10ML IV SOLN
INTRAVENOUS | Status: DC | PRN
  Administered 2023-08-31: 4 mg via INTRAVENOUS

## 2023-08-31 MED ORDER — LACTATED RINGERS IV SOLN: INTRAVENOUS | Status: DC

## 2023-08-31 MED ORDER — PHENYLEPHRINE 80 MCG/ML (10ML) SYRINGE FOR IV PUSH (FOR BLOOD PRESSURE SUPPORT)
PREFILLED_SYRINGE | INTRAVENOUS | Status: AC
  Filled 2023-08-31: qty 10

## 2023-08-31 MED ORDER — HYDROMORPHONE HCL 1 MG/ML IJ SOLN
1.0000 mg | Freq: Once | INTRAMUSCULAR | Status: AC
  Administered 2023-08-31: 1 mg via INTRAVENOUS

## 2023-08-31 MED ORDER — PHENYLEPHRINE 80 MCG/ML (10ML) SYRINGE FOR IV PUSH (FOR BLOOD PRESSURE SUPPORT)
PREFILLED_SYRINGE | INTRAVENOUS | Status: DC | PRN
  Administered 2023-08-31 (×2): 80 ug via INTRAVENOUS

## 2023-08-31 MED ORDER — NEOSTIGMINE METHYLSULFATE 10 MG/10ML IV SOLN
INTRAVENOUS | Status: AC
  Filled 2023-08-31: qty 1

## 2023-08-31 MED ORDER — ACETAMINOPHEN 500 MG PO TABS
1000.0000 mg | ORAL_TABLET | Freq: Once | ORAL | Status: AC
  Administered 2023-08-31: 1000 mg via ORAL

## 2023-08-31 MED ORDER — LIDOCAINE HCL (CARDIAC) PF 100 MG/5ML IV SOSY
PREFILLED_SYRINGE | INTRAVENOUS | Status: DC | PRN
  Administered 2023-08-31: 80 mg via INTRAVENOUS

## 2023-08-31 MED ORDER — FENTANYL CITRATE (PF) 100 MCG/2ML IJ SOLN
25.0000 ug | INTRAMUSCULAR | Status: DC | PRN
  Administered 2023-08-31 (×6): 25 ug via INTRAVENOUS

## 2023-08-31 MED ORDER — ONDANSETRON HCL 4 MG/2ML IJ SOLN: 4.0000 mg | Freq: Once | INTRAMUSCULAR | Status: DC | PRN

## 2023-08-31 MED ORDER — HYDROMORPHONE HCL 1 MG/ML IJ SOLN
1.0000 mg | Freq: Once | INTRAMUSCULAR | Status: AC
  Administered 2023-08-31: 1 mg via INTRAVENOUS
  Filled 2023-08-31: qty 1

## 2023-08-31 MED ORDER — ATROPINE SULFATE 0.4 MG/ML IV SOLN
INTRAVENOUS | Status: DC | PRN
Start: 2023-08-31 — End: 2023-08-31
  Administered 2023-08-31: .6 mg via INTRAVENOUS

## 2023-08-31 SURGICAL SUPPLY — 54 items
ADH SKN CLS APL DERMABOND .7 (GAUZE/BANDAGES/DRESSINGS) ×2
APL SRG 38 LTWT LNG FL B (MISCELLANEOUS) ×2
APPLICATOR ARISTA FLEXITIP XL (MISCELLANEOUS) ×2 IMPLANT
BAG DRN RND TRDRP ANRFLXCHMBR (UROLOGICAL SUPPLIES) ×2
BAG URINE DRAIN 2000ML AR STRL (UROLOGICAL SUPPLIES) ×2 IMPLANT
BLADE SURG SZ11 CARB STEEL (BLADE) ×2 IMPLANT
CATH FOLEY 2WAY 5CC 16FR (CATHETERS) ×2
CATH URTH 16FR FL 2W BLN LF (CATHETERS) ×2 IMPLANT
CORD MONOPOLAR M/FML 12FT (MISCELLANEOUS) ×2 IMPLANT
DERMABOND ADVANCED .7 DNX12 (GAUZE/BANDAGES/DRESSINGS) ×2 IMPLANT
DRSG TEGADERM 2-3/8X2-3/4 SM (GAUZE/BANDAGES/DRESSINGS) IMPLANT
GAUZE 4X4 16PLY ~~LOC~~+RFID DBL (SPONGE) ×2 IMPLANT
GAUZE SPONGE 2X2 8PLY STRL LF (GAUZE/BANDAGES/DRESSINGS) IMPLANT
GLOVE BIO SURGEON STRL SZ7 (GLOVE) ×6 IMPLANT
GLOVE INDICATOR 7.5 STRL GRN (GLOVE) ×3 IMPLANT
GOWN STRL REUS W/ TWL LRG LVL3 (GOWN DISPOSABLE) ×4 IMPLANT
GOWN STRL REUS W/TWL LRG LVL3 (GOWN DISPOSABLE) ×4
GRASPER SUT TROCAR 14GX15 (MISCELLANEOUS) ×1 IMPLANT
HEMOSTAT ARISTA ABSORB 3G PWDR (HEMOSTASIS) ×1 IMPLANT
IRRIGATION STRYKERFLOW (MISCELLANEOUS) ×1 IMPLANT
IRRIGATOR STRYKERFLOW (MISCELLANEOUS) ×2
IV NS 1000ML (IV SOLUTION) ×2
IV NS 1000ML BAXH (IV SOLUTION) ×2 IMPLANT
KIT PINK PAD W/HEAD ARE REST (MISCELLANEOUS) ×2 IMPLANT
KIT PINK PAD W/HEAD ARM REST (MISCELLANEOUS) ×2 IMPLANT
KIT TURNOVER CYSTO (KITS) ×2 IMPLANT
L-HOOK LAP DISP 36CM (ELECTROSURGICAL) ×2
LABEL OR SOLS (LABEL) ×2 IMPLANT
LHOOK LAP DISP 36CM (ELECTROSURGICAL) ×1 IMPLANT
MANIFOLD NEPTUNE II (INSTRUMENTS) ×2 IMPLANT
NDL HYPO 22X1.5 SAFETY MO (MISCELLANEOUS) IMPLANT
NEEDLE HYPO 22X1.5 SAFETY MO (MISCELLANEOUS) ×2 IMPLANT
NS IRRIG 500ML POUR BTL (IV SOLUTION) ×2 IMPLANT
PACK GYN LAPAROSCOPIC (MISCELLANEOUS) ×2 IMPLANT
PAD PREP OB/GYN DISP 24X41 (PERSONAL CARE ITEMS) ×2 IMPLANT
PENCIL SMOKE EVACUATOR COATED (MISCELLANEOUS) ×1 IMPLANT
SCISSORS METZENBAUM CVD 33 (INSTRUMENTS) ×1 IMPLANT
SCRUB CHG 4% DYNA-HEX 4OZ (MISCELLANEOUS) ×2 IMPLANT
SET TUBE SMOKE EVAC HIGH FLOW (TUBING) ×2 IMPLANT
SLEEVE Z-THREAD 5X100MM (TROCAR) ×2 IMPLANT
SOL PREP PVP 2OZ (MISCELLANEOUS) ×2
SOLUTION PREP PVP 2OZ (MISCELLANEOUS) ×2 IMPLANT
STRIP CLOSURE SKIN 1/4X4 (GAUZE/BANDAGES/DRESSINGS) IMPLANT
SUT MNCRL 4-0 (SUTURE) ×2
SUT MNCRL 4-0 27XMFL (SUTURE) ×2
SUT MNCRL AB 4-0 PS2 18 (SUTURE) ×2 IMPLANT
SUT VICRYL 0 UR6 27IN ABS (SUTURE) ×1 IMPLANT
SUTURE MNCRL 4-0 27XMF (SUTURE) ×1 IMPLANT
SYR 50ML LL SCALE MARK (SYRINGE) ×1 IMPLANT
SYS BAG RETRIEVAL 10MM (BASKET) ×2
SYSTEM BAG RETRIEVAL 10MM (BASKET) ×1 IMPLANT
TRAP FLUID SMOKE EVACUATOR (MISCELLANEOUS) ×2 IMPLANT
TROCAR Z-THREAD FIOS 12X100MM (TROCAR) ×1 IMPLANT
TROCAR Z-THREAD FIOS 5X100MM (TROCAR) ×2 IMPLANT

## 2023-08-31 NOTE — Transfer of Care (Signed)
Immediate Anesthesia Transfer of Care Note  Patient: Morgan Young  Procedure(s) Performed: LAPAROSCOPIC OVARIAN CYSTECTOMY (Left: Abdomen)  Patient Location: PACU  Anesthesia Type:General  Level of Consciousness: awake, drowsy, and patient cooperative  Airway & Oxygen Therapy: Patient Spontanous Breathing and Patient connected to face mask oxygen  Post-op Assessment: Report given to RN and Post -op Vital signs reviewed and stable  Post vital signs: Reviewed and stable  Last Vitals:  Vitals Value Taken Time  BP 142/79 08/31/23 0950  Temp    Pulse 88 08/31/23 0952  Resp 22 08/31/23 0952  SpO2 100 % 08/31/23 0952  Vitals shown include unfiled device data.  Last Pain:  Vitals:   08/31/23 0735  TempSrc: Tympanic  PainSc: 2          Complications: No notable events documented.

## 2023-08-31 NOTE — Anesthesia Preprocedure Evaluation (Addendum)
Anesthesia Evaluation  Patient identified by MRN, date of birth, ID band Patient awake    Reviewed: Allergy & Precautions, NPO status , Patient's Chart, lab work & pertinent test results  Airway Mallampati: II  TM Distance: >3 FB Neck ROM: Full    Dental  (+) Teeth Intact   Pulmonary neg pulmonary ROS   Pulmonary exam normal breath sounds clear to auscultation       Cardiovascular Exercise Tolerance: Good negative cardio ROS Normal cardiovascular exam Rhythm:Regular Rate:Normal     Neuro/Psych   Anxiety     negative neurological ROS  negative psych ROS   GI/Hepatic negative GI ROS, Neg liver ROS,,,  Endo/Other  negative endocrine ROS  Morbid obesity  Renal/GU negative Renal ROS  negative genitourinary   Musculoskeletal   Abdominal  (+) + obese  Peds negative pediatric ROS (+)  Hematology negative hematology ROS (+)   Anesthesia Other Findings Past Medical History: No date: Allergy No date: Anxiety No date: History of self-harm No date: History of self-harm     Comment:  cutting-last 2011 No date: History of urinary tract infection 2024: Ovarian cyst No date: Scoliosis  Past Surgical History: No date: Denies surgical hx No date: WISDOM TOOTH EXTRACTION     Comment:  four; age 27  BMI    Body Mass Index: 33.91 kg/m      Reproductive/Obstetrics negative OB ROS (+) Pregnancy                             Anesthesia Physical Anesthesia Plan  ASA: 2 and emergent  Anesthesia Plan: General   Post-op Pain Management:    Induction: Intravenous  PONV Risk Score and Plan: 1 and Ondansetron and Dexamethasone  Airway Management Planned: Oral ETT  Additional Equipment:   Intra-op Plan:   Post-operative Plan: Extubation in OR  Informed Consent: I have reviewed the patients History and Physical, chart, labs and discussed the procedure including the risks, benefits and  alternatives for the proposed anesthesia with the patient or authorized representative who has indicated his/her understanding and acceptance.     Dental Advisory Given  Plan Discussed with: CRNA and Surgeon  Anesthesia Plan Comments:        Anesthesia Quick Evaluation

## 2023-08-31 NOTE — ED Provider Notes (Addendum)
[facility] Provider Note    Event Date/Time   First MD Initiated Contact with Patient 08/31/23 [phone]     (approximate)   History   Abdominal Pain   HPI  Morgan Young is a 27 y.o. female G2P0101 who is approximately 8 weeks and 6 days pregnant who presents to the emergency department with severe left pelvic pain.  Patient reports pain started at 3 AM when she got up to go pee.  Has been diagnosed with an 8.8 cm left ovarian cyst.  States that it will intermittently cause her pain but tonight it was more severe.  Denies dysuria, hematuria, vaginal bleeding or discharge.  Patient was previously being seen by her [facility] clinic for this pregnancy but had to change clinics due to change in insurance.  She has an appointment to see [facility] on October 25 - she has not seen them yet.  She has had an ultrasound confirming an IUP.   History provided by patient.    Past Medical History:  Diagnosis Date   Allergy    Anxiety    History of self-harm    History of self-harm    cutting-last 2011   History of urinary tract infection    Ovarian cyst 2024   Scoliosis     Past Surgical History:  Procedure Laterality Date   Denies surgical hx     WISDOM TOOTH EXTRACTION     four; age 34    MEDICATIONS:  Prior to Admission medications   Medication Sig Start Date End Date Taking? Authorizing Provider  diphenhydrAMINE (BENADRYL) 25 mg capsule Take 25 mg by mouth every 6 (six) hours as needed.    [provider]  EPINEPHrine (EPIPEN 2-PAK) 0.3 mg/0.3 mL IJ SOAJ injection Inject 0.3 mg into the muscle as needed for anaphylaxis. 05/21/22   [name], PA-C  levocetirizine (XYZAL) 5 MG tablet Take 5 mg by mouth daily. 05/22/23   [provider]  montelukast (SINGULAIR) 10 MG tablet Take 10 mg by mouth daily. 05/22/23   [provider]  ondansetron (ZOFRAN-ODT) 4 MG disintegrating tablet Take 4 mg by mouth every 8 (eight) hours as  needed. 08/19/23   [provider]  Prenatal Vit-Fe Fumarate-FA (MULTIVITAMIN-PRENATAL) 27-0.8 MG TABS tablet Take 1 tablet by mouth daily at 12 noon.    [provider]  triamcinolone ointment (KENALOG) 0.1 % SMARTSIG:Sparingly Topical Twice Daily PRN 03/03/23   [provider]    Physical Exam   Triage Vital Signs: ED Triage Vitals  Encounter Vitals Group     BP 08/31/23 0504 108/63     Systolic BP Percentile --      Diastolic BP Percentile --      Pulse Rate 08/31/23 0504 70     Resp 08/31/23 0504 16     Temp 08/31/23 0504 98.2 F (36.8 C)     Temp Source 08/31/23 0504 Oral     SpO2 08/31/23 0504 100 %     Weight 08/31/23 0512 210 lb (95.3 kg)     Height 08/31/23 0512 5\' 6"  (1.676 m)     Head Circumference --      Peak Flow --      Pain Score 08/31/23 0512 0     Pain Loc --      Pain Education --      Exclude from Growth Chart --     Most recent vital signs: Vitals:   08/31/23 0504  BP: 108/63  Pulse: 70  Resp: 16  Temp: 98.2 F (36.8 C)  SpO2: 100%    CONSTITUTIONAL: Alert, responds appropriately to questions.  Appears extremely uncomfortable, tearful, pacing around the room HEAD: Normocephalic, atraumatic EYES: Conjunctivae clear, pupils appear equal, sclera nonicteric ENT: normal nose; moist mucous membranes NECK: Supple, normal ROM CARD: RRR; S1 and S2 appreciated RESP: Normal chest excursion without splinting or tachypnea; breath sounds clear and equal bilaterally; no wheezes, no rhonchi, no rales, no hypoxia or respiratory distress, speaking full sentences ABD/GI: Non-distended; soft, tender to palpation in the left pelvic region BACK: The back appears normal EXT: Normal ROM in all joints; no deformity noted, no edema SKIN: Normal color for age and race; warm; no rash on exposed skin NEURO: Moves all extremities equally, normal speech PSYCH: The patient's mood and manner are appropriate.   ED Results / Procedures / Treatments    LABS: (all labs ordered are listed, but only abnormal results are displayed) Labs Reviewed  COMPREHENSIVE METABOLIC PANEL - Abnormal; Notable for the following components:      Result Value   Sodium 132 (*)    CO2 19 (*)    Glucose, Bld 118 (*)    Calcium 8.3 (*)    Albumin 3.4 (*)    AST 12 (*)    All other components within normal limits  CBC - Abnormal; Notable for the following components:   RBC 3.80 (*)    Hemoglobin 11.3 (*)    HCT 33.0 (*)    All other components within normal limits  HCG, QUANTITATIVE, PREGNANCY - Abnormal; Notable for the following components:   hCG, Beta Chain, Sharene Butters, [place] 161,096 (*)    All other components within normal limits  LIPASE, BLOOD  URINALYSIS, ROUTINE W REFLEX MICROSCOPIC  PROTIME-INR  TYPE AND SCREEN     EKG:   RADIOLOGY: My personal review and interpretation of imaging: Left ovarian torsion.  I have personally reviewed all radiology reports.   US OB Comp Less 14 Wks  Result Date: 08/31/2023 CLINICAL DATA:  [id number] with pelvic pain in pregnancy EXAM: US PELVIC DOPPLER COMPLETE; OBSTETRIC <14 WK ULTRASOUND TECHNIQUE: Transabdominal ultrasound was performed for complete evaluation of the gestation as well as the maternal uterus, adnexal regions, and pelvic cul-de-sac. Color and pulsed Doppler interrogation was performed on the left ovary to assess for blood flow. The right ovary was nonvisualized on this evaluation. COMPARISON:  08/21/2023 and 08/14/2023. FINDINGS: Intrauterine gestational sac: Single. Yolk sac:  Visualized, measures 2.7 mm. Embryo:  Visualized. Cardiac Activity: Visualized. Heart Rate: 152 bpm MSD: Not measured, embryo visible. CRL:   16.2 mm   8 w 0 d +/-5 days; [place] EDC: 04/11/2024. Subchorionic hemorrhage:  None visualized. Maternal uterus/adnexae: Anteverted uterus measuring 11.2 x 6.1 x 7.3 cm (258.3 mL). No wall mass. The cervix is closed measuring 3.6 cm in length. Right ovary could not be seen today. Left ovary is  enlarged due to a simple cyst. Total measurements of the ovary 8.4 x 7.0 x 8.7 cm. The cyst measures similar to the last ultrasound measuring 8.5 x 6.6 x 7.5 cm. Blood flow could not be detected in the left ovary today whereas the prior studies showed good flow to this ovary. Findings consistent with left ovarian torsion. IMPRESSION: 1. Blood flow unable to be detected in the left ovary. Findings consistent with left ovarian torsion. 2. 8.5 cm simple left ovarian cyst. 3. No free fluid in the pelvis.  No paraovarian mass. 4. Living 8 weeks fetus,  ultrasound EDC 04/11/2024. 5. No subchorionic hemorrhage, cervical funneling or shortening, or fetal bradycardia. 6. Nonvisualization of the right ovary. 7. Critical Value/emergent results were called by telephone at the time of interpretation on 08/31/2023 at 7:05 am to provider [facility] , who verbally acknowledged these results. Electronically Signed   By: Almira Bar M.D.   On: 08/31/2023 07:18   US PELVIC DOPPLER (TORSION R/O OR MASS ARTERIAL FLOW)  Result Date: 08/31/2023 CLINICAL DATA:  [id number] with pelvic pain in pregnancy EXAM: US PELVIC DOPPLER COMPLETE; OBSTETRIC <14 WK ULTRASOUND TECHNIQUE: Transabdominal ultrasound was performed for complete evaluation of the gestation as well as the maternal uterus, adnexal regions, and pelvic cul-de-sac. Color and pulsed Doppler interrogation was performed on the left ovary to assess for blood flow. The right ovary was nonvisualized on this evaluation. COMPARISON:  08/21/2023 and 08/14/2023. FINDINGS: Intrauterine gestational sac: Single. Yolk sac:  Visualized, measures 2.7 mm. Embryo:  Visualized. Cardiac Activity: Visualized. Heart Rate: 152 bpm MSD: Not measured, embryo visible. CRL:   16.2 mm   8 w 0 d +/-5 days; [place] EDC: 04/11/2024. Subchorionic hemorrhage:  None visualized. Maternal uterus/adnexae: Anteverted uterus measuring 11.2 x 6.1 x 7.3 cm (258.3 mL). No wall mass. The cervix is closed measuring 3.6 cm in  length. Right ovary could not be seen today. Left ovary is enlarged due to a simple cyst. Total measurements of the ovary 8.4 x 7.0 x 8.7 cm. The cyst measures similar to the last ultrasound measuring 8.5 x 6.6 x 7.5 cm. Blood flow could not be detected in the left ovary today whereas the prior studies showed good flow to this ovary. Findings consistent with left ovarian torsion. IMPRESSION: 1. Blood flow unable to be detected in the left ovary. Findings consistent with left ovarian torsion. 2. 8.5 cm simple left ovarian cyst. 3. No free fluid in the pelvis.  No paraovarian mass. 4. Living 8 weeks fetus, ultrasound EDC 04/11/2024. 5. No subchorionic hemorrhage, cervical funneling or shortening, or fetal bradycardia. 6. Nonvisualization of the right ovary. 7. Critical Value/emergent results were called by telephone at the time of interpretation on 08/31/2023 at 7:05 am to provider [facility] , who verbally acknowledged these results. Electronically Signed   By: Almira Bar M.D.   On: 08/31/2023 07:18     PROCEDURES:  Critical Care performed: Yes, see critical care procedure note(s)   CRITICAL CARE Performed by: Baxter Hire Rokhaya Quinn   Total critical care time: 45 minutes  Critical care time was exclusive of separately billable procedures and treating other patients.  Critical care was necessary to treat or prevent imminent or life-threatening deterioration.  Critical care was time spent personally by me on the following activities: development of treatment plan with patient and/or surrogate as well as nursing, discussions with consultants, evaluation of patient's response to treatment, examination of patient, obtaining history from patient or surrogate, ordering and performing treatments and interventions, ordering and review of laboratory studies, ordering and review of radiographic studies, pulse oximetry and re-evaluation of patient's condition.   Procedures    IMPRESSION / MDM / ASSESSMENT AND  PLAN / ED COURSE  I reviewed the triage vital signs and the nursing notes.    Patient here with severe left pelvic pain with known 8.8 cm cyst of the left ovary.  She is currently 8 weeks, 6 days pregnant with her second pregnancy.     DIFFERENTIAL DIAGNOSIS (includes but not limited to):   Ovarian torsion, ruptured cyst, kidney stone, UTI, pyelonephritis, miscarriage  Patient's presentation is most consistent with acute presentation with potential threat to life or bodily function.   PLAN: Patient was seen immediately after being brought back to an ED room.  Patient appears extremely uncomfortable and is tearful, pacing.  IV pain medicine ordered.  Patient given consent to get IV pain medication in pregnancy.  I have messaged patient's nurse to give pain medication ASAP and called ultrasound technician who is coming to do ultrasound portably given my concern for torsion given how ill-appearing she is at this time.  Labs in triage so no leukocytosis, normal LFTs, creatinine, lipase.  Urine pending.  MEDICATIONS GIVEN IN ED: Medications  0.9 %  sodium chloride infusion (has no administration in time range)  morphine (PF) 4 MG/ML injection 4 mg (4 mg Intravenous Given 08/31/23 0623)  ondansetron (ZOFRAN) injection 4 mg (4 mg Intravenous Given 08/31/23 0622)  HYDROmorphone (DILAUDID) injection 1 mg (1 mg Intravenous Given 08/31/23 0637)  HYDROmorphone (DILAUDID) injection 1 mg (1 mg Intravenous Given 08/31/23 0658)     ED COURSE:  6:36 AM  Pt moaning in pain.  Having a hard time lying still for ultrasound.  No improvement with morphine.  Will give Dilaudid.  Encouraged her to get through ultrasound so that we could determine if this is a torsion versus a ruptured cyst or if this is something completely different such as a kidney stone.  6:48 AM  [place] tech Raynelle Fanning states that she believes the ovary is torsed and will call the radiologist for an emergent read.  I have placed a page to OB/GYN  on-call for [facility] clinic.  6:50 AM  Spoke with Dr. Dalbert Garnet with OB/GYN who will see patient.  Will keep n.p.o. and start IV fluids.  Updated patient, patient's nurse and charge nurse as well.   Will call patient's fianc per her request.  He will come up after the procedure.  7:05 AM  Called by Dr. Raynelle Dick with radiology who confirms left ovarian torsion.  No pregnancy complications seen.   CONSULTS: OB/GYN consulted for left ovarian torsion.   OUTSIDE RECORDS REVIEWED: Reviewed recent OB/GYN notes at [facility] clinic.       FINAL CLINICAL IMPRESSION(S) / ED DIAGNOSES   Final diagnoses:  Left ovarian cyst  Torsion of left ovary     Rx / DC Orders   ED Discharge Orders     None        Note:  This document was prepared using Dragon voice recognition software and may include unintentional dictation errors.       Joyceline Maiorino, Layla Maw, DO 08/31/23 [phone]

## 2023-08-31 NOTE — ED Notes (Signed)
Patient to waiting room via EMS from home for lower left abdominal pain.  Reports seen and dx'd in recently in ED with ovarian cyst and that she is pregnant.  BP 124/80, HR 77, 99% on room air.

## 2023-08-31 NOTE — Op Note (Signed)
Morgan Young PROCEDURE DATE: 08/31/2023  INDICATIONS: Acute pelvic pain  PREOPERATIVE DIAGNOSIS: Left simple 8.5cm Adnexal mass POSTOPERATIVE DIAGNOSIS: Left ovarian torsion PROCEDURE: Exam under anesthesia, laparoscopic left ovarian detorsion; left ovarian cystectomy SURGEON:  Dr. Christeen Douglas ASSISTANT: Dr. Thomasene Mohair, MD  ANESTHESIOLOGIST: Darleene Cleaver, Gerrit Heck, MD Anesthesiologist: Darleene Cleaver, Gerrit Heck, MD CRNA: Elmarie Mainland, CRNA  INDICATIONS: 27 y.o. G2P0101 at [redacted]w[redacted]d with history of acute-onset pelvic pain desiring surgical evaluation.   Please see preoperative notes for further details.  Risks of surgery were discussed with the patient including but not limited to: bleeding which may require transfusion or reoperation; infection which may require antibiotics; injury to bowel, bladder, ureters or other surrounding organs; need for additional procedures including laparotomy; thromboembolic phenomenon, incisional problems and other postoperative/anesthesia complications. Written informed consent was obtained.    FINDINGS:  Enlarged gravid uterus, normal right ovary and fallopian tube. Simple Ovarian cyst on left side. Ovarian torsion was noted for 2 twists. No other abdominal/pelvic abnormality.  Normal upper abdomen. No Hemoperitoneum No luteal ovarian cyst noted on the right ovary.  ANESTHESIA:    General INTRAVENOUS FLUIDS: 900 ml ESTIMATED BLOOD LOSS: 20 ml HEMOPERITONEUM: 0ml URINE OUTPUT: 200 ml SPECIMENS: Ovarian cystwall, left COMPLICATIONS: None immediate  PROCEDURE IN DETAIL:  The patient had sequential compression devices applied to her lower extremities while in the preoperative area.  She was then taken to the operating room where general anesthesia was administered and was found to be adequate.  She was placed in the dorsal lithotomy position, and was prepped and draped in a sterile manner.  A Foley catheter was inserted into her bladder and attached  to constant drainage and a sponge stick placed gently in the vagina .  After an adequate timeout was performed, attention was turned to the abdomen where an umbilical incision was made with the scalpel.  The Optiview 5-mm trocar and sleeve were then advanced without difficulty with the laparoscope under direct visualization into the abdomen.  The abdomen was then insufflated with carbon dioxide gas and adequate pneumoperitoneum was obtained.   A detailed survey of the patient's pelvis and abdomen revealed the findings as mentioned above. Two additional 5mm trocars were placed in the bilateral lower quadrants under direct visualization.  The ovarian cyst was evaluated and dissected out from the normal ovarian tissue. Hemostasis was assured with electrocautery and 3g Arista. The pelvis was irrigated and all fluid and blood removed. A 12mm port umbilically placed and the cyst wall removed in a bag.  The operative site was surveyed, and it was found to be hemostatic.  No intraoperative injury to surrounding organs was noted.   Pictures were taken of the quadrants and pelvis. The fascia was closed with 2 figure of eight stiches using the PMI. The abdomen was desufflated and all instruments were then removed from the patient's abdomen. The vaginal sponge stick was removed without complications.  All incisions were closed with 4-0 Vicryl and Dermabond.   The patient tolerated the procedures well.  All instruments, needles, and sponge counts were correct x 2. The patient was taken to the recovery room in stable condition.   We will plan for ultrasound to confirm fetal cardiac activity. No luteal ovarian cyst noted on the right ovary and therefore patient will be placed on progesterone until the 2nd trimester to support pregnancy.

## 2023-08-31 NOTE — ED Triage Notes (Signed)
Pt reports LLQ abdominal pain that lasted an hour and does not have pain at present. Pt reports hx of ovarian cysts that cause her pain intermittently. Pt reports being approx [redacted] weeks pregnant. Pt denies vaginal bleeding.

## 2023-08-31 NOTE — Anesthesia Procedure Notes (Signed)
Procedure Name: Intubation Date/Time: 08/31/2023 8:06 AM  Performed by: Elmarie Mainland, CRNAPre-anesthesia Checklist: Patient identified, Emergency Drugs available, Suction available and Patient being monitored Patient Re-evaluated:Patient Re-evaluated prior to induction Oxygen Delivery Method: Circle system utilized Preoxygenation: Pre-oxygenation with 100% oxygen Induction Type: IV induction, Rapid sequence and Cricoid Pressure applied Laryngoscope Size: McGraph and 3 Grade View: Grade I Tube type: Oral Tube size: 6.5 mm Number of attempts: 1 Airway Equipment and Method: Stylet and Video-laryngoscopy Placement Confirmation: ETT inserted through vocal cords under direct vision, positive ETCO2 and breath sounds checked- equal and bilateral Secured at: 21 cm Tube secured with: Tape

## 2023-08-31 NOTE — H&P (Addendum)
Consult History and Physical   SERVICE: Gynecology   Patient Name: Morgan Young Patient MRN:   [id number]  CC: Pelvic pain  HPI: Morgan Young is a 27 y.o. G2P0101 with suspected ovarian torsion LEFT ovary, 8 cm simple cyst. [redacted]w[redacted]d pregnant dated by 04/06/23 scan. LMP 07/06/23 approx  No prior surgeries. Pain started at 3am. Known simple cyst.   TVUS confirmed.   Review of Systems: positives in bold GEN:   fevers, chills, weight changes, appetite changes, fatigue, night sweats HEENT:  HA, vision changes, hearing loss, congestion, rhinorrhea, sinus pressure, dysphagia CV:   CP, palpitations PULM:  SOB, cough GI:  abd pain, N/V/D/C GU:  dysuria, urgency, frequency MSK:  arthralgias, myalgias, back pain, swelling SKIN:  rashes, color changes, pallor NEURO:  numbness, weakness, tingling, seizures, dizziness, tremors PSYCH:  depression, anxiety, behavioral problems, confusion  HEME/LYMPH:  easy bruising or bleeding ENDO: diaphoresis  Past Obstetrical History: OB History     Gravida  2   Para  1   Term  0   Preterm  1   AB  0   Living  1      SAB  0   IAB  0   Ectopic  0   Multiple  0   Live Births  1           Past Gynecologic History: Patient's last menstrual period was 07/06/2023 (approximate).   Past Medical History: Past Medical History:  Diagnosis Date   Allergy    Anxiety    History of self-harm    History of self-harm    cutting-last 2011   History of urinary tract infection    Ovarian cyst 2024   Scoliosis     Past Surgical History:   Past Surgical History:  Procedure Laterality Date   Denies surgical hx     WISDOM TOOTH EXTRACTION     four; age 32    Family History:  family history includes Asthma in her mother; Depression in her mother; Diabetes in her sister; Diabetes Mellitus I in her mother; HIV/AIDS in her maternal grandmother; Healthy in her brother and sister; Heart murmur in her daughter; Hyperlipidemia in her maternal  grandmother and mother; Hypertension in her maternal grandmother and mother; Liver disease in her brother and mother; Other in her father; Pancreatic cancer in her maternal grandfather.  Social History:  Social History   Socioeconomic History   Marital status: Single    Spouse name: Not on file   Number of children: 1   Years of education: 12   Highest education level: High school graduate  Occupational History   Occupation: Designer, industrial/product  Tobacco Use   Smoking status: Never   Smokeless tobacco: Never  Vaping Use   Vaping status: Former   Quit date: 08/04/2023  Substance and Sexual Activity   Alcohol use: Not Currently    Comment: Last ETOH use approx 1 year ago   Drug use: Not Currently    Types: Marijuana    Comment: Last use January or February 2020.   Sexual activity: Yes    Partners: Male    Birth control/protection: None  Other Topics Concern   Not on file  Social History Narrative   Not on file   Social Determinants of Health   Financial Resource Strain: Low Risk  (08/20/2023)   Overall Financial Resource Strain (CARDIA)    Difficulty of Paying Living Expenses: Not hard at all  Food Insecurity: No Food Insecurity (08/20/2023)  Hunger Vital Sign    Worried About Running Out of Food in the Last Year: Never true    Ran Out of Food in the Last Year: Never true  Transportation Needs: No Transportation Needs (08/20/2023)   PRAPARE - Administrator, Civil Service (Medical): No    Lack of Transportation (Non-Medical): No  Physical Activity: Sufficiently Active (08/20/2023)   Exercise Vital Sign    Days of Exercise per Week: 7 days    Minutes of Exercise per Session: 60 min  Recent Concern: Physical Activity - Inactive (05/25/2023)   Exercise Vital Sign    Days of Exercise per Week: 0 days    Minutes of Exercise per Session: 0 min  Stress: No Stress Concern Present (08/20/2023)   [employer] of Occupational Health - Occupational Stress  Questionnaire    Feeling of Stress : Not at all  Recent Concern: Stress - Stress Concern Present (05/25/2023)   [employer] of Occupational Health - Occupational Stress Questionnaire    Feeling of Stress : To some extent  Social Connections: Moderately Isolated (08/20/2023)   Social Connection and Isolation Panel [NHANES]    Frequency of Communication with Friends and Family: Three times a week    Frequency of Social Gatherings with Friends and Family: Once a week    Attends Religious Services: Never    Database administrator or Organizations: No    Attends Banker Meetings: Never    Marital Status: Living with partner  Intimate Partner Violence: Not At Risk (08/20/2023)   Humiliation, Afraid, Rape, and Kick questionnaire    Fear of Current or Ex-Partner: No    Emotionally Abused: No    Physically Abused: No    Sexually Abused: No    Home Medications:  Medications reconciled in EPIC  No current facility-administered medications on file prior to encounter.   Current Outpatient Medications on File Prior to Encounter  Medication Sig Dispense Refill   diphenhydrAMINE (BENADRYL) 25 mg capsule Take 25 mg by mouth every 6 (six) hours as needed.     EPINEPHrine (EPIPEN 2-PAK) 0.3 mg/0.3 mL IJ SOAJ injection Inject 0.3 mg into the muscle as needed for anaphylaxis. 2 each 0   levocetirizine (XYZAL) 5 MG tablet Take 5 mg by mouth daily.     montelukast (SINGULAIR) 10 MG tablet Take 10 mg by mouth daily.     ondansetron (ZOFRAN-ODT) 4 MG disintegrating tablet Take 4 mg by mouth every 8 (eight) hours as needed.     Prenatal Vit-Fe Fumarate-FA (MULTIVITAMIN-PRENATAL) 27-0.8 MG TABS tablet Take 1 tablet by mouth daily at 12 noon.     triamcinolone ointment (KENALOG) 0.1 % SMARTSIG:Sparingly Topical Twice Daily PRN      Allergies:  Allergies  Allergen Reactions   Peanut-Containing Drug Products Hives   Cantaloupe (Diagnostic) Swelling    Rash on face, swelling in eyes    Watermelon [Citrullus Vulgaris] Hives    Physical Exam:  Temp:  [98.2 F (36.8 C)] 98.2 F (36.8 C) (10/07 0504) Pulse Rate:  [70] 70 (10/07 0504) Resp:  [16] 16 (10/07 0504) BP: (108)/(63) 108/63 (10/07 0504) SpO2:  [100 %] 100 % (10/07 0504) Weight:  [95.3 kg] 95.3 kg (10/07 0512)   General Appearance:  Well developed, well nourished, no acute distress, alert and oriented x3 HEENT:  Normocephalic atraumatic, extraocular movements intact, moist mucous membranes Cardiovascular:  Normal S1/S2, regular rate and rhythm, no murmurs Pulmonary:  clear to auscultation, no wheezes, rales or rhonchi,  symmetric air entry, good air exchange Abdomen:  Bowel sounds present, soft, notably tender on left without peritoneal signs, nondistended, n Extremities:  Full range of motion,  Skin:  normal coloration and turgor, no rashes, no suspicious skin lesions noted  Neurologic:  Cranial nerves 2-12 grossly intact, normal muscle tone,  Psychiatric:  Normal mood and affect, appropriate Pelvic:  deferred   Labs/Studies:   CBC and Coags:  Lab Results  Component Value Date   WBC 6.2 08/31/2023   NEUTOPHILPCT 45 05/25/2023   EOSPCT 2 12/08/2016   BASOPCT 0 12/08/2016   LYMPHOPCT 7 12/08/2016   HGB 11.3 (L) 08/31/2023   HCT 33.0 (L) 08/31/2023   MCV 86.8 08/31/2023   PLT 268 08/31/2023   CMP:  Lab Results  Component Value Date   NA 132 (L) 08/31/2023   K 3.6 08/31/2023   CL 106 08/31/2023   CO2 19 (L) 08/31/2023   BUN 11 08/31/2023   CREATININE 0.66 08/31/2023   CREATININE 0.69 08/14/2023   CREATININE 0.80 05/25/2023   PROT 7.1 08/31/2023   BILITOT 0.6 08/31/2023   ALT 10 08/31/2023   AST 12 (L) 08/31/2023   ALKPHOS 45 08/31/2023   Other Labs:   Other Imaging: US OB LESS THAN 14 WEEKS W/ OB TRANSVAGINAL AND DOPPLER  Result Date: 08/26/2023 CLINICAL DATA:  viability/low fetal heart tones on previous scan EXAM: OBSTETRIC <14 WK [place] AND TRANSVAGINAL OB US WITH DOPPLER TECHNIQUE: Ob  ultrasound was performed for complete evaluation of the gestation as well as the maternal uterus, adnexal regions, and pelvic cul-de-sac. COMPARISON:  08/14/2023. FINDINGS: Intrauterine gestational sac: Single Yolk sac:  Visualized. Embryo:  Visualized. Cardiac Activity: Visualized. Heart Rate: 138 bpm CRL:   0.8 cm = 6 weeks 6 days [place] EDC: 04/09/2024 Subchorionic hemorrhage:  None visualized. Adnexa: Left ovarian simple appearing cyst measures 6.8 x 7.2 x 8.8 cm. IMPRESSION: 1. Single viable intrauterine pregnancy measuring 6 weeks 6 days with an ultrasound [facility] 04/09/2024. 2. Left ovarian simple appearing 8.8 cm cyst, a stable finding. Electronically Signed   By: Layla Maw M.D.   On: 08/26/2023 17:24   US OB LESS THAN 14 WEEKS W/ OB TRANSVAGINAL AND DOPPLER  Result Date: 08/14/2023 CLINICAL DATA:  Left lower quadrant pain. Evaluate for ectopic pregnancy. EXAM: OBSTETRIC <14 WK [place] AND TRANSVAGINAL OB US DOPPLER ULTRASOUND OF OVARIES TECHNIQUE: Both transabdominal and transvaginal ultrasound examinations were performed for complete evaluation of the gestation as well as the maternal uterus, adnexal regions, and pelvic cul-de-sac. Transvaginal technique was performed to assess early pregnancy. Color and duplex Doppler ultrasound was utilized to evaluate blood flow to the ovaries. COMPARISON:  None Available. FINDINGS: Intrauterine gestational sac: Single Yolk sac:  Visualized. Embryo:  Visualized. Cardiac Activity: Visualized. Heart Rate: 77 bpm CRL:   5.6 mm   6 w 2 d                  [place] EDC: 04/06/2024 Subchorionic hemorrhage:  None visualized. Maternal uterus/adnexae: There is a simple cyst in the left ovary measuring 8.8 x 6.9 x 6.6 cm. The right ovary appears within normal limits. There is trace free fluid in the pelvis. Pulsed Doppler evaluation of both ovaries demonstrates normal appearing low-resistance arterial and venous waveforms. IMPRESSION: 1. Single live intrauterine gestation measuring 6  weeks 2 days by crown-rump length. 2. Cardiac activity is present, but heart rate is low measuring 77 beats per minute. Recommend short-term follow-up ultrasound to confirm viability. 3. 8.8 cm simple  cyst in the left ovary. Gynecology consultation suggested. Recommend follow-up US in 3-6 months. Note: This recommendation does not apply to premenarchal patients or to those with increased risk (genetic, family history, elevated tumor markers or other high-risk factors) of ovarian cancer. Reference: Radiology 2019 Nov; 293(2):359-371. Electronically Signed   By: Darliss Cheney M.D.   On: 08/14/2023 18:35     Assessment / Plan:   Morgan Young is a 27 y.o. G2P0101 who presents with 4 hours of pelvic pain, with suspicion for ovarian torsion.   Consents signed today. Risks of dx lap with possible oophorectomy and salpingectomy and evacuation of hemoperitoneum if present were discussed with the patient including but not limited to: bleeding which may require transfusion; infection which may require antibiotics; injury to uterus or surrounding organs; intrauterine scarring which may impair future fertility if D&C is required; need for additional procedures including laparotomy or laparoscopy; and other postoperative/anesthesia complications. Written informed consent was obtained.  This is a scheduled same-day surgery. She will have a postop visit in 2 weeks to review operative findings and pathology.  FHT confirmed by formal ultrasound, will repeat in the PACU. Anesthesia adjusted

## 2023-08-31 NOTE — ED Notes (Signed)
Pt belongings in bag, this RN updated pt that transport is on the way. Consent form being sent with pt, Next RN informed that that consent is not complete, since Doctor has not been by the discuss the surgery.

## 2023-08-31 NOTE — Discharge Instructions (Addendum)
Laparoscopic Ovarian Surgery Discharge Instructions  I am sending you home with acetaminophen for pain. You also have a narcotic, oxycodone, to take as needed.   Sometimes the cyst we removed plays a role in supporting the pregnancy until the second trimester. Therefore, I am prescribing you a medicine called progesterone to support the pregnancy until your 13th week.  Postop constipation is a major cause of pain. Stay well hydrated, walk as you tolerate, and take over the counter senna as well as stool softeners if you need them.  If you have any questions, you can send a MyChart message or call the [facility] at [phone].  RISKS AND COMPLICATIONS  Infection. Bleeding. Injury to surrounding organs. Anesthetic side effects.   PROCEDURE  You may be given a medicine to help you relax (sedative) before the procedure. You will be given a medicine to make you sleep (general anesthetic) during the procedure. A tube will be put down your throat to help your breath while under general anesthesia. Several small cuts (incisions) are made in the lower abdominal area and one incision is made near the belly button. Your abdominal area will be inflated with a safe gas (carbon dioxide). This helps give the surgeon room to operate, visualize, and helps the surgeon avoid other organs. A thin, lighted tube (laparoscope) with a camera attached is inserted into your abdomen through the incision near the belly button. Other small instruments may also be inserted through other abdominal incisions. The ovary is located and are removed. After the ovary is removed, the gas is released from the abdomen. The incisions will be closed with stitches (sutures), and Dermabond. A bandage may be placed over the incisions.  AFTER THE PROCEDURE  You will also have some mild abdominal discomfort for 3-7 days. You will be given pain medicine to ease any discomfort. As long as there are no problems, you may be  allowed to go home. Someone will need to drive you home and be with you for at least 24 hours once home. You may have some mild discomfort in the throat. This is from the tube placed in your throat while you were sleeping. You may experience discomfort in the shoulder area from some trapped air between the liver and diaphragm. This sensation is normal and will slowly go away on its own.  HOME CARE INSTRUCTIONS  Take all medicines as directed. Only take over-the-counter or prescription medicines for pain, discomfort, or fever as directed by your caregiver. Resume daily activities as directed. Showers are preferred over baths for 2 weeks. You may resume sexual activities in 1 week or as you feel you would like to. Do not drive while taking narcotics.  SEEK MEDICAL CARE IF: . There is increasing abdominal pain. You feel lightheaded or faint. You have the chills. You have an oral temperature above 102 F (38.9 C). There is pus-like (purulent) drainage from any of the wounds. You are unable to pass gas or have a bowel movement. You feel sick to your stomach (nauseous) or throw up (vomit) and can't control it with your medicines.  MAKE SURE YOU:  Understand these instructions. Will watch your condition. Will get help right away if you are not doing well or get worse.  ExitCare Patient Information 2013 [place], [place].  AMBULATORY SURGERY  DISCHARGE INSTRUCTIONS   The drugs that you were given will stay in your system until tomorrow so for the next 24 hours you should not:  Drive an automobile Make any legal  decisions Drink any alcoholic beverage   You may resume regular meals tomorrow.  Today it is better to start with liquids and gradually work up to solid foods.  You may eat anything you prefer, but it is better to start with liquids, then soup and crackers, and gradually work up to solid foods.   Please notify your doctor immediately if you have any unusual bleeding, trouble  breathing, redness and pain at the surgery site, drainage, fever, or pain not relieved by medication.    Additional Instructions:    Please contact your physician with any problems or Same Day Surgery at [phone], Monday through Friday 6 am to 4 pm, or  at [facility] number at [phone].

## 2023-08-31 NOTE — Anesthesia Postprocedure Evaluation (Signed)
Anesthesia Post Note  Patient: Morgan Young  Procedure(s) Performed: LAPAROSCOPIC OVARIAN CYSTECTOMY (Left: Abdomen)  Patient location during evaluation: PACU Anesthesia Type: General Level of consciousness: awake and awake and alert Pain management: pain level controlled Vital Signs Assessment: post-procedure vital signs reviewed and stable Respiratory status: spontaneous breathing Cardiovascular status: stable Anesthetic complications: no   No notable events documented.   Last Vitals:  Vitals:   08/31/23 1015 08/31/23 1030  BP: 130/69 124/62  Pulse: 79 90  Resp: 19 19  Temp: 36.8 C 36.6 C  SpO2: 98% 98%    Last Pain:  Vitals:   08/31/23 1027  TempSrc:   PainSc: 6                  VAN STAVEREN,Krystyl Cannell

## 2023-09-01 ENCOUNTER — Encounter: Payer: Self-pay | Admitting: Obstetrics and Gynecology

## 2023-09-01 LAB — SURGICAL PATHOLOGY

## 2023-09-10 ENCOUNTER — Encounter: Payer: Self-pay | Admitting: Obstetrics and Gynecology

## 2023-09-17 NOTE — Plan of Care (Signed)
[facility] Tonsillectomy/Adenoidectomy, Postoperative PEDS care plan entered in error.

## 2023-09-18 ENCOUNTER — Encounter: Payer: Medicaid Other | Admitting: Obstetrics

## 2023-09-18 NOTE — Progress Notes (Deleted)
OBSTETRIC INITIAL PRENATAL VISIT  Subjective:    Morgan Young is being seen today for her first obstetrical visit.  This {is/is not:9024} a planned pregnancy. She is a 27 y.o. G15P0101 female at [redacted]w[redacted]d gestation, Estimated Date of Delivery: 04/05/24 with Patient's last menstrual period was 07/06/2023 (approximate).,  consistent with [redacted]w[redacted]d sono. Her obstetrical history is significant for  history of preterm labor at 36weeks . Relationship with FOB: {fob:16621}. Patient {does/does not:19097} intend to breast feed. Pregnancy history fully reviewed.    OB History  Gravida Para Term Preterm AB Living  2 1 0 1 0 1  SAB IAB Ectopic Multiple Live Births  0 0 0 0 1    # Outcome Date GA Lbr Len/2nd Weight Sex Type Anes PTL Lv  2 Current           1 Preterm 04/18/20 [redacted]w[redacted]d  4 lb 2 oz (1.871 kg) F Vag-Spont  Y LIV    Gynecologic History:  Last pap smear was ***.  Results were {Normal/Abnormal:304960160}.  {Actions; denies-reports:120008} h/o abnormal pap smears in the past.  {Actions; denies-reports:120008} history of STIs.  Contraception prior to conception:    Past Medical History:  Diagnosis Date   Allergy    Anxiety    History of self-harm    History of self-harm    cutting-last 2011   History of urinary tract infection    Ovarian cyst 2024   Scoliosis     Family History  Problem Relation Age of Onset   Asthma Mother    Hypertension Mother    Diabetes Mellitus I Mother        Chaned to type 1 2 weeks ago   Hyperlipidemia Mother    Liver disease Mother    Depression Mother    Other Father        unknown medical history   Diabetes Sister    Healthy Sister    Liver disease Brother    Healthy Brother    Hypertension Maternal Grandmother    Hyperlipidemia Maternal Grandmother    HIV/AIDS Maternal Grandmother    Pancreatic cancer Maternal Grandfather    Heart murmur Daughter     Past Surgical History:  Procedure Laterality Date   Denies surgical hx     LAPAROSCOPIC  OVARIAN CYSTECTOMY Left 08/31/2023   Procedure: LAPAROSCOPIC OVARIAN CYSTECTOMY;  Surgeon: Christeen Douglas, MD;  Location: [facility] ORS;  Service: Gynecology;  Laterality: Left;   WISDOM TOOTH EXTRACTION     four; age 80    Social History   Socioeconomic History   Marital status: Single    Spouse name: Not on file   Number of children: 1   Years of education: 12   Highest education level: High school graduate  Occupational History   Occupation: Designer, industrial/product  Tobacco Use   Smoking status: Never   Smokeless tobacco: Never  Vaping Use   Vaping status: Former   Quit date: 08/04/2023  Substance and Sexual Activity   Alcohol use: Not Currently    Comment: Last ETOH use approx 1 year ago   Drug use: Not Currently    Types: Marijuana    Comment: Last use January or February 2020.   Sexual activity: Yes    Partners: Male    Birth control/protection: None  Other Topics Concern   Not on file  Social History Narrative   Not on file   Social Determinants of Health   Financial Resource Strain: Low Risk  (08/20/2023)  Overall Financial Resource Strain (CARDIA)    Difficulty of Paying Living Expenses: Not hard at all  Food Insecurity: No Food Insecurity (08/20/2023)   Hunger Vital Sign    Worried About Running Out of Food in the Last Year: Never true    Ran Out of Food in the Last Year: Never true  Transportation Needs: No Transportation Needs (08/20/2023)   PRAPARE - Administrator, Civil Service (Medical): No    Lack of Transportation (Non-Medical): No  Physical Activity: Sufficiently Active (08/20/2023)   Exercise Vital Sign    Days of Exercise per Week: 7 days    Minutes of Exercise per Session: 60 min  Recent Concern: Physical Activity - Inactive (05/25/2023)   Exercise Vital Sign    Days of Exercise per Week: 0 days    Minutes of Exercise per Session: 0 min  Stress: No Stress Concern Present (08/20/2023)   [employer] of Occupational Health -  Occupational Stress Questionnaire    Feeling of Stress : Not at all  Recent Concern: Stress - Stress Concern Present (05/25/2023)   [employer] of Occupational Health - Occupational Stress Questionnaire    Feeling of Stress : To some extent  Social Connections: Moderately Isolated (08/20/2023)   Social Connection and Isolation Panel [NHANES]    Frequency of Communication with Friends and Family: Three times a week    Frequency of Social Gatherings with Friends and Family: Once a week    Attends Religious Services: Never    Database administrator or Organizations: No    Attends Banker Meetings: Never    Marital Status: Living with partner  Intimate Partner Violence: Not At Risk (08/20/2023)   Humiliation, Afraid, Rape, and Kick questionnaire    Fear of Current or Ex-Partner: No    Emotionally Abused: No    Physically Abused: No    Sexually Abused: No    Current Outpatient Medications on File Prior to Visit  Medication Sig Dispense Refill   diphenhydrAMINE (BENADRYL) 25 mg capsule Take 25 mg by mouth every 6 (six) hours as needed.     docusate sodium (COLACE) 100 MG capsule Take 1 capsule (100 mg total) by mouth 2 (two) times daily. To keep stools soft 30 capsule 0   EPINEPHrine (EPIPEN 2-PAK) 0.3 mg/0.3 mL IJ SOAJ injection Inject 0.3 mg into the muscle as needed for anaphylaxis. 2 each 0   levocetirizine (XYZAL) 5 MG tablet Take 5 mg by mouth daily.     montelukast (SINGULAIR) 10 MG tablet Take 10 mg by mouth daily.     ondansetron (ZOFRAN-ODT) 4 MG disintegrating tablet Take 4 mg by mouth every 8 (eight) hours as needed.     oxyCODONE (OXY IR/ROXICODONE) 5 MG immediate release tablet Take 1 tablet (5 mg total) by mouth every 4 (four) hours as needed for severe pain. 10 tablet 0   Prenatal Vit-Fe Fumarate-FA (MULTIVITAMIN-PRENATAL) 27-0.8 MG TABS tablet Take 1 tablet by mouth daily at 12 noon.     progesterone (PROMETRIUM) 200 MG capsule Place one capsule vaginally at  bedtime 30 capsule 3   triamcinolone ointment (KENALOG) 0.1 % SMARTSIG:Sparingly Topical Twice Daily PRN     No current facility-administered medications on file prior to visit.    Allergies  Allergen Reactions   Peanut-Containing Drug Products Hives   Cantaloupe (Diagnostic) Swelling    Rash on face, swelling in eyes   Watermelon [Citrullus Vulgaris] Hives     Review of Systems General: Not  Present- Fever, Weight Loss and Weight Gain. Skin: Not Present- Rash. HEENT: Not Present- Blurred Vision, Headache and Bleeding Gums. Respiratory: Not Present- Difficulty Breathing. Breast: Not Present- Breast Mass. Cardiovascular: Not Present- Chest Pain, Elevated Blood Pressure, Fainting / Blacking Out and Shortness of Breath. Gastrointestinal: Not Present- Abdominal Pain, Constipation, Nausea and Vomiting. Female Genitourinary: Not Present- Frequency, Painful Urination, Pelvic Pain, Vaginal Bleeding, Vaginal Discharge, Contractions, regular, Fetal Movements Decreased, Urinary Complaints and Vaginal Fluid. Musculoskeletal: Not Present- Back Pain and Leg Cramps. Neurological: Not Present- Dizziness. Psychiatric: Not Present- Depression.     Objective:   Last menstrual period 07/06/2023.  There is no height or weight on file to calculate BMI.  General Appearance:    Alert, cooperative, no distress, appears stated age  Head:    Normocephalic, without obvious abnormality, atraumatic  Eyes:    PERRL, conjunctiva/corneas clear, EOM's intact, both eyes  Ears:    Normal external ear canals, both ears  Nose:   Nares normal, septum midline, mucosa normal, no drainage or sinus tenderness  Throat:   Lips, mucosa, and tongue normal; teeth and gums normal  Neck:   Supple, symmetrical, trachea midline, no adenopathy; thyroid: no enlargement/tenderness/nodules; no carotid bruit or JVD  Back:     Symmetric, no curvature, ROM normal, no CVA tenderness  Lungs:     Clear to auscultation bilaterally,  respirations unlabored  Chest Wall:    No tenderness or deformity   Heart:    Regular rate and rhythm, S1 and S2 normal, no murmur, rub or gallop  Breast Exam:    No tenderness, masses, or nipple abnormality  Abdomen:     Soft, non-tender, bowel sounds active all four quadrants, no masses, no organomegaly.  FHT ***  bpm.  Genitalia:    Pelvic:external genitalia normal, vagina without lesions, discharge, or tenderness, rectovaginal septum  normal. Cervix normal in appearance, no cervical motion tenderness, no adnexal masses or tenderness.  Pregnancy positive findings: uterine enlargement: *** wk size, nontender.   Rectal:    Normal external sphincter.  No hemorrhoids appreciated. Internal exam not done.   Extremities:   Extremities normal, atraumatic, no cyanosis or edema  Pulses:   2+ and symmetric all extremities  Skin:   Skin color, texture, turgor normal, no rashes or lesions  Lymph nodes:   Cervical, supraclavicular, and axillary nodes normal  Neurologic:   CNII-XII intact, normal strength, sensation and reflexes throughout     Assessment:   No diagnosis found.  Plan:   Supervision of ***normal/high risk pregnancy  - Initial labs reviewed. - Prenatal vitamins encouraged. - Problem list reviewed and updated. - New OB counseling:  The patient has been given an overview regarding routine prenatal care.  Recommendations regarding diet, weight gain, and exercise in pregnancy were given. - Prenatal testing, optional genetic testing, and ultrasound use in pregnancy were reviewed.  Traditional genetic screening vs cell-fee DNA genetic screening discussed, including risks and benefits. Testing {requests/ordered/declines:14581}. - Benefits of Breast Feeding were discussed. The patient is encouraged to consider nursing her baby post partum.  There are no diagnoses linked to this encounter.    Follow up in 4 weeks.    Cornelius Moras, CMA [facility]

## 2023-09-25 ENCOUNTER — Encounter: Payer: Self-pay | Admitting: Certified Nurse Midwife

## 2023-09-25 ENCOUNTER — Other Ambulatory Visit (HOSPITAL_COMMUNITY)
Admission: RE | Admit: 2023-09-25 | Discharge: 2023-09-25 | Disposition: A | Discharge status: Home / Self Care | Payer: Medicaid Other | Source: Ambulatory Visit | Attending: Obstetrics | Admitting: Obstetrics

## 2023-09-25 ENCOUNTER — Ambulatory Visit (INDEPENDENT_AMBULATORY_CARE_PROVIDER_SITE_OTHER): Payer: Medicaid Other | Admitting: Certified Nurse Midwife

## 2023-09-25 VITALS — BP 106/64 | Wt 216.3 lb

## 2023-09-25 DIAGNOSIS — Z1379 Encounter for other screening for genetic and chromosomal anomalies: Secondary | ICD-10-CM

## 2023-09-25 DIAGNOSIS — Z130 Encounter for screening for diseases of the blood and blood-forming organs and certain disorders involving the immune mechanism: Secondary | ICD-10-CM

## 2023-09-25 DIAGNOSIS — Z124 Encounter for screening for malignant neoplasm of cervix: Secondary | ICD-10-CM

## 2023-09-25 DIAGNOSIS — Z3481 Encounter for supervision of other normal pregnancy, first trimester: Secondary | ICD-10-CM | POA: Diagnosis not present

## 2023-09-25 DIAGNOSIS — Z113 Encounter for screening for infections with a predominantly sexual mode of transmission: Secondary | ICD-10-CM

## 2023-09-25 DIAGNOSIS — T7589XA Other specified effects of external causes, initial encounter: Secondary | ICD-10-CM

## 2023-09-25 DIAGNOSIS — Z0184 Encounter for antibody response examination: Secondary | ICD-10-CM

## 2023-09-25 DIAGNOSIS — Z0283 Encounter for blood-alcohol and blood-drug test: Secondary | ICD-10-CM

## 2023-09-25 DIAGNOSIS — Z3A12 12 weeks gestation of pregnancy: Secondary | ICD-10-CM

## 2023-09-25 DIAGNOSIS — O09299 Supervision of pregnancy with other poor reproductive or obstetric history, unspecified trimester: Secondary | ICD-10-CM | POA: Insufficient documentation

## 2023-09-25 MED ORDER — METOCLOPRAMIDE HCL 10 MG PO TABS: 10.0000 mg | ORAL_TABLET | Freq: Three times a day (TID) | ORAL | 3 refills | Status: DC

## 2023-09-25 MED ORDER — ASPIRIN 81 MG PO TBEC: 81.0000 mg | DELAYED_RELEASE_TABLET | Freq: Every day | ORAL | 3 refills | Status: DC

## 2023-09-25 MED ORDER — PROMETHAZINE HCL 12.5 MG PO TABS: 12.5000 mg | ORAL_TABLET | Freq: Four times a day (QID) | ORAL | 2 refills | Status: DC | PRN

## 2023-09-25 NOTE — Patient Instructions (Signed)
First Trimester of Pregnancy  The first trimester of pregnancy starts on the first day of your last menstrual period until the end of week 12. This is months 1 through 3 of pregnancy. A week after a sperm fertilizes an egg, the egg will implant into the wall of the uterus and begin to develop into a baby. By the end of 12 weeks, all the baby's organs will be formed and the baby will be 2-3 inches in size. Body changes during your first trimester Your body goes through many changes during pregnancy. The changes vary and generally return to normal after your baby is born. Physical changes You may gain or lose weight. Your breasts may begin to grow larger and become tender. The tissue that surrounds your nipples (areola) may become darker. Dark spots or blotches (chloasma or mask of pregnancy) may develop on your face. You may have changes in your hair. These can include thickening or thinning of your hair or changes in texture. Health changes You may feel nauseous, and you may vomit. You may have heartburn. You may develop headaches. You may develop constipation. Your gums may bleed and may be sensitive to brushing and flossing. Other changes You may tire easily. You may urinate more often. Your menstrual periods will stop. You may have a loss of appetite. You may develop cravings for certain kinds of food. You may have changes in your emotions from day to day. You may have more vivid and strange dreams. Follow these instructions at home: Medicines Follow your health care provider's instructions regarding medicine use. Specific medicines may be either safe or unsafe to take during pregnancy. Do not take any medicines unless told to by your health care provider. Take a prenatal vitamin that contains at least 600 micrograms (mcg) of folic acid. Eating and drinking Eat a healthy diet that includes fresh fruits and vegetables, whole grains, good sources of protein such as meat, eggs, or tofu,  and low-fat dairy products. Avoid raw meat and unpasteurized juice, milk, and cheese. These carry germs that can harm you and your baby. If you feel nauseous or you vomit: Eat 4 or 5 small meals a day instead of 3 large meals. Try eating a few soda crackers. Drink liquids between meals instead of during meals. You may need to take these actions to prevent or treat constipation: Drink enough fluid to keep your urine pale yellow. Eat foods that are high in fiber, such as beans, whole grains, and fresh fruits and vegetables. Limit foods that are high in fat and processed sugars, such as fried or sweet foods. Activity Exercise only as directed by your health care provider. Most people can continue their usual exercise routine during pregnancy. Try to exercise for 30 minutes at least 5 days a week. Stop exercising if you develop pain or cramping in the lower abdomen or lower back. Avoid exercising if it is very hot or humid or if you are at high altitude. Avoid heavy lifting. If you choose to, you may have sex unless your health care provider tells you not to. Relieving pain and discomfort Wear a good support bra to relieve breast tenderness. Rest with your legs elevated if you have leg cramps or low back pain. If you develop bulging veins (varicose veins) in your legs: Wear support hose as told by your health care provider. Elevate your feet for 15 minutes, 3-4 times a day. Limit salt in your diet. Safety Wear your seat belt at all times when  driving or riding in a car. Talk with your health care provider if someone is verbally or physically abusive to you. Talk with your health care provider if you are feeling sad or have thoughts of hurting yourself. Lifestyle Do not use hot tubs, steam rooms, or saunas. Do not douche. Do not use tampons or scented sanitary pads. Do not use herbal remedies, alcohol, illegal drugs, or medicines that are not approved by your health care provider. Chemicals  in these products can harm your baby. Do not use any products that contain nicotine or tobacco, such as cigarettes, e-cigarettes, and chewing tobacco. If you need help quitting, ask your health care provider. Avoid cat litter boxes and soil used by cats. These carry germs that can cause birth defects in the baby and possibly loss of the unborn baby (fetus) by miscarriage or stillbirth. General instructions During routine prenatal visits in the first trimester, your health care provider will do a physical exam, perform necessary tests, and ask you how things are going. Keep all follow-up visits. This is important. Ask for help if you have counseling or nutritional needs during pregnancy. Your health care provider can offer advice or refer you to specialists for help with various needs. Schedule a dentist appointment. At home, brush your teeth with a soft toothbrush. Floss gently. Write down your questions. Take them to your prenatal visits. Where to find more information American Pregnancy Association: americanpregnancy.org [employer] of Obstetricians and Gynecologists: https://www.todd-brady.net/ Office on [employer] Health: Might[employer].nz Contact a health care provider if you have: Dizziness. A fever. Mild pelvic cramps, pelvic pressure, or nagging pain in the abdominal area. Nausea, vomiting, or diarrhea that lasts for 24 hours or longer. A bad-smelling vaginal discharge. Pain when you urinate. Known exposure to a contagious illness, such as chickenpox, measles, Zika virus, HIV, or hepatitis. Get help right away if you have: Spotting or bleeding from your vagina. Severe abdominal cramping or pain. Shortness of breath or chest pain. Any kind of trauma, such as from a fall or a car crash. New or increased pain, swelling, or redness in an arm or leg. Summary The first trimester of pregnancy starts on the first day of your last menstrual period until the end of week  12 (months 1 through 3). Eating 4 or 5 small meals a day rather than 3 large meals may help to relieve nausea and vomiting. Do not use any products that contain nicotine or tobacco, such as cigarettes, e-cigarettes, and chewing tobacco. If you need help quitting, ask your health care provider. Keep all follow-up visits. This is important. This information is not intended to replace advice given to you by your health care provider. Make sure you discuss any questions you have with your health care provider. Document Revised: 04/18/2020 Document Reviewed: 02/23/2020 Elsevier Patient Education  2024 [employer] Inc[employer]  Second Trimester of Pregnancy  The second trimester of pregnancy is from week 13 through week 27. This is months 4 through 6 of pregnancy. The second trimester is often a time when you feel your best. Your body has adjusted to being pregnant, and you begin to feel better physically. During the second trimester: Morning sickness has lessened or stopped completely. You may have more energy. You may have an increase in appetite. The second trimester is also a time when the unborn baby (fetus) is growing rapidly. At the end of the sixth month, the fetus may be up to 12 inches long and weigh about 1 pounds. You will likely  begin to feel the baby move (quickening) between 16 and 20 weeks of pregnancy. Body changes during your second trimester Your body continues to go through many changes during your second trimester. The changes vary and generally return to normal after the baby is born. Physical changes Your weight will continue to increase. You will notice your lower abdomen bulging out. You may begin to get stretch marks on your hips, abdomen, and breasts. Your breasts will continue to grow and to become tender. Dark spots or blotches (chloasma or mask of pregnancy) may develop on your face. A dark line from your belly button to the pubic area (linea nigra) may appear. You may have  changes in your hair. These can include thickening of your hair, rapid growth, and changes in texture. Some people also have hair loss during or after pregnancy, or hair that feels dry or thin. Health changes You may develop headaches. You may have heartburn. You may develop constipation. You may develop hemorrhoids or swollen, bulging veins (varicose veins). Your gums may bleed and may be sensitive to brushing and flossing. You may urinate more often because the fetus is pressing on your bladder. You may have back pain. This is caused by: Weight gain. Pregnancy hormones that are relaxing the joints in your pelvis. A shift in weight and the muscles that support your balance. Follow these instructions at home: Medicines Follow your health care provider's instructions regarding medicine use. Specific medicines may be either safe or unsafe to take during pregnancy. Do not take any medicines unless approved by your health care provider. Take a prenatal vitamin that contains at least 600 micrograms (mcg) of folic acid. Eating and drinking Eat a healthy diet that includes fresh fruits and vegetables, whole grains, good sources of protein such as meat, eggs, or tofu, and low-fat dairy products. Avoid raw meat and unpasteurized juice, milk, and cheese. These carry germs that can harm you and your baby. You may need to take these actions to prevent or treat constipation: Drink enough fluid to keep your urine pale yellow. Eat foods that are high in fiber, such as beans, whole grains, and fresh fruits and vegetables. Limit foods that are high in fat and processed sugars, such as fried or sweet foods. Activity Exercise only as directed by your health care provider. Most people can continue their usual exercise routine during pregnancy. Try to exercise for 30 minutes at least 5 days a week. Stop exercising if you develop contractions in your uterus. Stop exercising if you develop pain or cramping in the  lower abdomen or lower back. Avoid exercising if it is very hot or humid or if you are at a high altitude. Avoid heavy lifting. If you choose to, you may have sex unless your health care provider tells you not to. Relieving pain and discomfort Wear a supportive bra to prevent discomfort from breast tenderness. Take warm sitz baths to soothe any pain or discomfort caused by hemorrhoids. Use hemorrhoid cream if your health care provider approves. Rest with your legs raised (elevated) if you have leg cramps or low back pain. If you develop varicose veins: Wear support hose as told by your health care provider. Elevate your feet for 15 minutes, 3-4 times a day. Limit salt in your diet. Safety Wear your seat belt at all times when driving or riding in a car. Talk with your health care provider if someone is verbally or physically abusive to you. Lifestyle Do not use hot tubs, steam rooms,  or saunas. Do not douche. Do not use tampons or scented sanitary pads. Avoid cat litter boxes and soil used by cats. These carry germs that can cause birth defects in the baby and possibly loss of the fetus by miscarriage or stillbirth. Do not use herbal remedies, alcohol, illegal drugs, or medicines that are not approved by your health care provider. Chemicals in these products can harm your baby. Do not use any products that contain nicotine or tobacco, such as cigarettes, e-cigarettes, and chewing tobacco. If you need help quitting, ask your health care provider. General instructions During a routine prenatal visit, your health care provider will do a physical exam and other tests. He or she will also discuss your overall health. Keep all follow-up visits. This is important. Ask your health care provider for a referral to a local prenatal education class. Ask for help if you have counseling or nutritional needs during pregnancy. Your health care provider can offer advice or refer you to specialists for help  with various needs. Where to find more information American Pregnancy Association: americanpregnancy.org [employer] of [employer]stetricians and Gynecologists: https://www.todd-brady.net/ Office on [employer] Health: MightyReward[employer]co.nz Contact a health care provider if you have: A headache that does not go away when you take medicine. Vision changes or you see spots in front of your eyes. Mild pelvic cramps, pelvic pressure, or nagging pain in the abdominal area. Persistent nausea, vomiting, or diarrhea. A bad-smelling vaginal discharge or foul-smelling urine. Pain when you urinate. Sudden or extreme swelling of your face, hands, ankles, feet, or legs. A fever. Get help right away if you: Have fluid leaking from your vagina. Have spotting or bleeding from your vagina. Have severe abdominal cramping or pain. Have difficulty breathing. Have chest pain. Have fainting spells. Have not felt your baby move for the time period told by your health care provider. Have new or increased pain, swelling, or redness in an arm or leg. Summary The second trimester of pregnancy is from week 13 through week 27 (months 4 through 6). Do not use herbal remedies, alcohol, illegal drugs, or medicines that are not approved by your health care provider. Chemicals in these products can harm your baby. Exercise only as directed by your health care provider. Most people can continue their usual exercise routine during pregnancy. Keep all follow-up visits. This is important. This information is not intended to replace advice given to you by your health care provider. Make sure you discuss any questions you have with your health care provider. Document Revised: 04/18/2020 Document Reviewed: 02/23/2020 Elsevier Patient Education  2024 [employer].

## 2023-09-25 NOTE — Progress Notes (Signed)
NEW OB HISTORY AND PHYSICAL  SUBJECTIVE:       Morgan Young is a 27 y.o. G53P1001 female, Patient's last menstrual period was 07/06/2023 (approximate)., Estimated Date of Delivery: 04/05/24, [redacted]w[redacted]d, presents today for establishment of Prenatal Care. She reports headache and nausea with vomiting for the last two weeks. Continues taking progesterone as prescribed after ovarian torsion surgery. Hx of IUGR, daughter was 4lb at birth & spent a week in NICU.  Social history Partner/Relationship: Lowella Grip Living situation: fiancee, daughter Exercise: no regular, walking Substance use: no T/E/D  Indications for ASA therapy (per uptodate) One of the following: Previous pregnancy with preeclampsia, especially early onset and with an adverse outcome No Multifetal gestation No Chronic hypertension No Type 1 or 2 diabetes mellitus No Chronic kidney disease No Autoimmune disease (antiphospholipid syndrome, systemic lupus erythematosus) No  Two or more of the following: Nulliparity No Obesity (body mass index >30 kg/m2) Yes Family history of preeclampsia in mother or sister No Age >=35 years No Sociodemographic characteristics (African American race, low socioeconomic level) Yes Personal risk factors (eg, previous pregnancy with low birth weight or small for gestational age infant, previous adverse pregnancy outcome [eg, stillbirth], interval >10 years between pregnancies) Yes   Gynecologic History Patient's last menstrual period was 07/06/2023 (approximate). Normal Contraception: none Last Pap: 2021. Results were: normal  Obstetric History OB History  Gravida Para Term Preterm AB Living  2 1 1  0 0 1  SAB IAB Ectopic Multiple Live Births  0 0 0 0 1    # Outcome Date GA Lbr Len/2nd Weight Sex Type Anes PTL Lv  2 Current           1 Term 04/18/20 [redacted]w[redacted]d  4 lb 2 oz (1.871 kg) F Vag-Spont  Y LIV    Past Medical History:  Diagnosis Date   Allergy    Anxiety    History of  self-harm    History of self-harm    cutting-last 2011   History of urinary tract infection    Ovarian cyst 2024   Scoliosis     Past Surgical History:  Procedure Laterality Date   Denies surgical hx     LAPAROSCOPIC OVARIAN CYSTECTOMY Left 08/31/2023   Procedure: LAPAROSCOPIC OVARIAN CYSTECTOMY;  Surgeon: Christeen Douglas, MD;  Location: [facility] ORS;  Service: Gynecology;  Laterality: Left;   WISDOM TOOTH EXTRACTION     four; age 40    Current Outpatient Medications on File Prior to Visit  Medication Sig Dispense Refill   acetaminophen (TYLENOL) 325 MG tablet Take 650 mg by mouth every 6 (six) hours as needed.     EPINEPHrine (EPIPEN 2-PAK) 0.3 mg/0.3 mL IJ SOAJ injection Inject 0.3 mg into the muscle as needed for anaphylaxis. 2 each 0   levocetirizine (XYZAL) 5 MG tablet Take 5 mg by mouth daily.     montelukast (SINGULAIR) 10 MG tablet Take 10 mg by mouth daily.     ondansetron (ZOFRAN-ODT) 4 MG disintegrating tablet Take 4 mg by mouth every 8 (eight) hours as needed.     Prenatal Vit-Fe Fumarate-FA (MULTIVITAMIN-PRENATAL) 27-0.8 MG TABS tablet Take 1 tablet by mouth daily at 12 noon.     progesterone (PROMETRIUM) 200 MG capsule Place one capsule vaginally at bedtime 30 capsule 3   triamcinolone ointment (KENALOG) 0.1 % SMARTSIG:Sparingly Topical Twice Daily PRN     No current facility-administered medications on file prior to visit.    Allergies  Allergen Reactions   Peanut-Containing Drug Products Hives  Cantaloupe (Diagnostic) Swelling    Rash on face, swelling in eyes   Watermelon [Citrullus Vulgaris] Hives    Social History   Socioeconomic History   Marital status: Single    Spouse name: Not on file   Number of children: 1   Years of education: 12   Highest education level: High school graduate  Occupational History   Occupation: Designer, industrial/product  Tobacco Use   Smoking status: Never   Smokeless tobacco: Never  Vaping Use   Vaping status: Former    Quit date: 08/04/2023  Substance and Sexual Activity   Alcohol use: Not Currently    Comment: Last ETOH use approx 1 year ago   Drug use: Not Currently    Types: Marijuana    Comment: Last use January or February 2020.   Sexual activity: Yes    Partners: Male    Birth control/protection: None  Other Topics Concern   Not on file  Social History Narrative   Not on file   Social Determinants of Health   Financial Resource Strain: Low Risk  (08/20/2023)   Overall Financial Resource Strain (CARDIA)    Difficulty of Paying Living Expenses: Not hard at all  Food Insecurity: No Food Insecurity (08/20/2023)   Hunger Vital Sign    Worried About Running Out of Food in the Last Year: Never true    Ran Out of Food in the Last Year: Never true  Transportation Needs: No Transportation Needs (08/20/2023)   PRAPARE - Administrator, Civil Service (Medical): No    Lack of Transportation (Non-Medical): No  Physical Activity: Sufficiently Active (08/20/2023)   Exercise Vital Sign    Days of Exercise per Week: 7 days    Minutes of Exercise per Session: 60 min  Recent Concern: Physical Activity - Inactive (05/25/2023)   Exercise Vital Sign    Days of Exercise per Week: 0 days    Minutes of Exercise per Session: 0 min  Stress: No Stress Concern Present (08/20/2023)   [employer] of Occupational Health - Occupational Stress Questionnaire    Feeling of Stress : Not at all  Recent Concern: Stress - Stress Concern Present (05/25/2023)   [employer] of Occupational Health - Occupational Stress Questionnaire    Feeling of Stress : To some extent  Social Connections: Moderately Isolated (08/20/2023)   Social Connection and Isolation Panel [NHANES]    Frequency of Communication with Friends and Family: Three times a week    Frequency of Social Gatherings with Friends and Family: Once a week    Attends Religious Services: Never    Database administrator or Organizations: No    Attends  Banker Meetings: Never    Marital Status: Living with partner  Intimate Partner Violence: Not At Risk (08/20/2023)   Humiliation, Afraid, Rape, and Kick questionnaire    Fear of Current or Ex-Partner: No    Emotionally Abused: No    Physically Abused: No    Sexually Abused: No    Family History  Problem Relation Age of Onset   Asthma Mother    Hypertension Mother    Diabetes Mellitus I Mother        Chaned to type 1 2 weeks ago   Hyperlipidemia Mother    Liver disease Mother    Depression Mother    Other Father        unknown medical history   Diabetes Sister    Healthy Sister  Liver disease Brother    Healthy Brother    Hypertension Maternal Grandmother    Hyperlipidemia Maternal Grandmother    HIV/AIDS Maternal Grandmother    Pancreatic cancer Maternal Grandfather    Heart murmur Daughter     The following portions of the patient's history were reviewed and updated as appropriate: allergies, current medications, past OB history, past medical history, past surgical history, past family history, past social history, and problem list. Review of Systems  Constitutional:  Positive for malaise/fatigue.  Respiratory: Negative.    Cardiovascular: Negative.   Gastrointestinal:  Positive for nausea and vomiting.    OBJECTIVE: BP 106/64   Wt 216 lb 4.8 oz (98.1 kg)   LMP 07/06/2023 (Approximate)   BMI 34.91 kg/m   Physical Exam Vitals reviewed.  Constitutional:      Appearance: Normal appearance.  Cardiovascular:     Rate and Rhythm: Normal rate and regular rhythm.     Heart sounds: Normal heart sounds.  Pulmonary:     Effort: Pulmonary effort is normal.  Abdominal:     Palpations: Abdomen is soft.  Genitourinary:    General: Normal vulva.     Vagina: Normal.     Cervix: Friability present.  Skin:    General: Skin is warm and dry.  Neurological:     General: No focal deficit present.     Mental Status: She is alert and oriented to person, place,  and time.  Psychiatric:        Mood and Affect: Mood normal.        Behavior: Behavior normal.     ASSESSMENT: Normal pregnancy   PLAN: Routine prenatal care. We discussed an overview of prenatal care and when to call. Reviewed diet, exercise, and weight gain recommendations in pregnancy. Discussed benefits of breastfeeding and lactation resources at [facility]. I reviewed labs and answered all questions.  1. Encounter for supervision of other normal pregnancy in first trimester - NOB Panel - Culture, OB Urine - Monitor Drug Profile 14(MW) - Nicotine screen, urine - Urinalysis, Routine w reflex microscopic - MaterniT 21 plus Core, Blood - acetaminophen (TYLENOL) 325 MG tablet; Take 650 mg by mouth every 6 (six) hours as needed. - COMPLETE METABOLIC PANEL WITH GFR - HgB Z6X - TSH + free T4 - promethazine (PHENERGAN) 12.5 MG tablet; Take 1 tablet (12.5 mg total) by mouth every 6 (six) hours as needed for nausea or vomiting.  Dispense: 60 tablet; Refill: 2 - metoCLOPramide (REGLAN) 10 MG tablet; Take 1 tablet (10 mg total) by mouth 3 (three) times daily before meals.  Dispense: 90 tablet; Refill: 3 - Cervicovaginal ancillary only - Cytology - PAP - aspirin EC 81 MG tablet; Take 1 tablet (81 mg total) by mouth daily. Swallow whole. Begin at 12-16w ega, on or after 09/26/23  Dispense: 90 tablet; Refill: 3 - Interpretation: - Urine Culture, OB Reflex  2. [redacted] weeks gestation of pregnancy - NOB Panel - Culture, OB Urine - Monitor Drug Profile 14(MW) - Nicotine screen, urine - Urinalysis, Routine w reflex microscopic - MaterniT 21 plus Core, Blood - COMPLETE METABOLIC PANEL WITH GFR - HgB W9U - TSH + free T4 - Cytology - PAP  3. Screening for cervical cancer - Cytology - PAP  4. Encounter for drug screening - Monitor Drug Profile 14(MW) - Nicotine screen, urine  5. Genetic screening - MaterniT 21 plus Core, Blood  6. Exposure to cat feces, initial encounter - NOB Panel  7.  Immunity status testing -  NOB Panel  8. Screening examination for STD (sexually transmitted disease) - Cytology - PAP  9. Screening for iron deficiency anemia - NOB Panel  10. IUGR (intrauterine growth restriction) in prior pregnancy, pregnant Begin daily baby ASA after 12w EGA.  EDC changed based on ultrasound 9/27, will not use prior ultrasound with EDC of 5/13 given low FHR at 77.  Persistent nausea despite zofran, phenergan & reglan prescribed.  Dominica Severin, CNM

## 2023-09-26 LAB — CBC/D/PLT+RPR+RH+ABO+RUBIGG...
Antibody Screen: NEGATIVE
Basophils Absolute: 0 10*3/uL (ref 0.0–0.2)
Basos: 1 %
EOS (ABSOLUTE): 0.1 10*3/uL (ref 0.0–0.4)
Eos: 3 %
HCV Ab: NONREACTIVE
HIV Screen 4th Generation wRfx: NONREACTIVE
Hematocrit: 37.4 % (ref 34.0–46.6)
Hemoglobin: 12.2 g/dL (ref 11.1–15.9)
Hepatitis B Surface Ag: NEGATIVE
Immature Grans (Abs): 0 10*3/uL (ref 0.0–0.1)
Immature Granulocytes: 0 %
Lymphocytes Absolute: 1.4 10*3/uL (ref 0.7–3.1)
Lymphs: 26 %
MCH: 30 pg (ref 26.6–33.0)
MCHC: 32.6 g/dL (ref 31.5–35.7)
MCV: 92 fL (ref 79–97)
Monocytes Absolute: 0.4 10*3/uL (ref 0.1–0.9)
Monocytes: 7 %
Neutrophils Absolute: 3.5 10*3/uL (ref 1.4–7.0)
Neutrophils: 63 %
Platelets: 273 10*3/uL (ref 150–450)
RBC: 4.06 x10E6/uL (ref 3.77–5.28)
RDW: 13.5 % (ref 11.7–15.4)
RPR Ser Ql: NONREACTIVE
Rh Factor: POSITIVE
Rubella Antibodies, IGG: 1.26 {index} (ref >0.99)
Varicella zoster IgG: NONREACTIVE
WBC: 5.4 10*3/uL (ref 3.4–10.8)

## 2023-09-26 LAB — URINALYSIS, ROUTINE W REFLEX MICROSCOPIC
Bilirubin, UA: NEGATIVE
Glucose, UA: NEGATIVE
Ketones, UA: NEGATIVE
Leukocytes,UA: NEGATIVE
Nitrite, UA: NEGATIVE
Protein,UA: NEGATIVE
RBC, UA: NEGATIVE
Specific Gravity, UA: 1.014 (ref 1.005–1.030)
Urobilinogen, Ur: 0.2 mg/dL (ref 0.2–1.0)
pH, UA: 7.5 (ref 5.0–7.5)

## 2023-09-26 LAB — HEMOGLOBIN A1C
Est. average glucose Bld gHb Est-mCnc: 120 mg/dL
Hgb A1c MFr Bld: 5.8 % — ABNORMAL HIGH (ref 4.8–5.6)

## 2023-09-26 LAB — HCV INTERPRETATION

## 2023-09-26 LAB — TSH+FREE T4
Free T4: 0.99 ng/dL (ref 0.82–1.77)
TSH: 1.34 u[IU]/mL (ref 0.450–4.500)

## 2023-09-27 LAB — URINE CULTURE, OB REFLEX

## 2023-09-27 LAB — CULTURE, OB URINE

## 2023-09-28 LAB — MONITOR DRUG PROFILE 14(MW)
Amphetamine Scrn, Ur: NEGATIVE ng/mL
BARBITURATE SCREEN URINE: NEGATIVE ng/mL
BENZODIAZEPINE SCREEN, URINE: NEGATIVE ng/mL
Buprenorphine, Urine: NEGATIVE ng/mL
CANNABINOIDS UR QL SCN: NEGATIVE ng/mL
Cocaine (Metab) Scrn, Ur: NEGATIVE ng/mL
Creatinine(Crt), U: 79.1 mg/dL (ref 20.0–300.0)
Fentanyl, Urine: NEGATIVE pg/mL
Meperidine Screen, Urine: NEGATIVE ng/mL
Methadone Screen, Urine: NEGATIVE ng/mL
OXYCODONE+OXYMORPHONE UR QL SCN: NEGATIVE ng/mL
Opiate Scrn, Ur: NEGATIVE ng/mL
Ph of Urine: 7.2 (ref 4.5–8.9)
Phencyclidine Qn, Ur: NEGATIVE ng/mL
Propoxyphene Scrn, Ur: NEGATIVE ng/mL
SPECIFIC GRAVITY: 1.011
Tramadol Screen, Urine: NEGATIVE ng/mL

## 2023-09-28 LAB — CERVICOVAGINAL ANCILLARY ONLY
Bacterial Vaginitis (gardnerella): NEGATIVE
Candida Glabrata: NEGATIVE
Candida Vaginitis: NEGATIVE
Chlamydia: NEGATIVE
Comment: NEGATIVE
Comment: NEGATIVE
Comment: NEGATIVE
Comment: NEGATIVE
Comment: NEGATIVE
Comment: NORMAL
Neisseria Gonorrhea: NEGATIVE
Trichomonas: NEGATIVE

## 2023-09-28 LAB — NICOTINE SCREEN, URINE: Cotinine Ql Scrn, Ur: NEGATIVE ng/mL

## 2023-09-29 ENCOUNTER — Telehealth: Payer: Self-pay

## 2023-09-29 ENCOUNTER — Other Ambulatory Visit: Payer: Self-pay

## 2023-09-29 DIAGNOSIS — R7309 Other abnormal glucose: Secondary | ICD-10-CM | POA: Insufficient documentation

## 2023-09-29 DIAGNOSIS — Z131 Encounter for screening for diabetes mellitus: Secondary | ICD-10-CM

## 2023-09-29 HISTORY — DX: Encounter for screening for diabetes mellitus: Z13.1

## 2023-09-29 MED ORDER — TRIAMCINOLONE ACETONIDE 0.1 % EX OINT: TOPICAL_OINTMENT | Freq: Two times a day (BID) | CUTANEOUS | 1 refills | Status: DC

## 2023-09-29 NOTE — Progress Notes (Signed)
1 hr gtt orders placed per Hartley Barefoot, CNM for Elevated A1C.

## 2023-09-29 NOTE — Telephone Encounter (Signed)
Pt calling; went to p/u rxs and pharm told her to contact us.  Pt states she was to contact us about the ASA 81mg . Pharm is ordering the metoclopramide 10mg ; pt needs refill on triamcinalone 0.1% oint. [phone]  Called pharm; pt needs to get ASA otc as it's not covered by her ins; the metoclopramide will be ready for her to p/u later today.  I refilled triamcinalone 0.1% oint.  Pt aware.

## 2023-09-30 LAB — CYTOLOGY - PAP: Diagnosis: NEGATIVE

## 2023-10-01 ENCOUNTER — Other Ambulatory Visit: Payer: Medicaid Other

## 2023-10-01 DIAGNOSIS — Z131 Encounter for screening for diabetes mellitus: Secondary | ICD-10-CM

## 2023-10-01 DIAGNOSIS — R7309 Other abnormal glucose: Secondary | ICD-10-CM

## 2023-10-01 LAB — MATERNIT 21 PLUS CORE, BLOOD
Fetal Fraction: 15
Result (T21): NEGATIVE
Trisomy 13 (Patau syndrome): NEGATIVE
Trisomy 18 (Edwards syndrome): NEGATIVE
Trisomy 21 (Down syndrome): NEGATIVE

## 2023-10-02 LAB — GLUCOSE, 1 HOUR GESTATIONAL: Gestational Diabetes Screen: 96 mg/dL (ref 70–139)

## 2023-10-07 ENCOUNTER — Telehealth: Payer: Self-pay

## 2023-10-07 NOTE — Telephone Encounter (Signed)
Pt calling to see if the blood test was back yet for the gender;  adv it is back; pt wanted to know the gender - adv it is a girl; adv the rest of the test was normal as well.

## 2023-10-13 ENCOUNTER — Telehealth: Payer: Self-pay

## 2023-10-13 NOTE — Telephone Encounter (Signed)
TRIAGE VOICEMAIL: Patient inquiring if her due date has changed or if it is still the same 04/06/24. She has not been updated about any lab results that have came back.

## 2023-10-13 NOTE — Telephone Encounter (Signed)
Recording states # you have dialed is not in service. Please check the number and try your call again. My chart message sent.

## 2023-10-21 ENCOUNTER — Ambulatory Visit: Payer: Medicaid Other | Admitting: Obstetrics

## 2023-10-21 VITALS — BP 100/60 | HR 86 | Wt 215.0 lb

## 2023-10-21 DIAGNOSIS — Z3A15 15 weeks gestation of pregnancy: Secondary | ICD-10-CM

## 2023-10-21 DIAGNOSIS — E669 Obesity, unspecified: Secondary | ICD-10-CM

## 2023-10-21 DIAGNOSIS — Z2839 Other underimmunization status: Secondary | ICD-10-CM | POA: Insufficient documentation

## 2023-10-21 DIAGNOSIS — O99212 Obesity complicating pregnancy, second trimester: Secondary | ICD-10-CM

## 2023-10-21 DIAGNOSIS — Z3480 Encounter for supervision of other normal pregnancy, unspecified trimester: Secondary | ICD-10-CM

## 2023-10-21 NOTE — Progress Notes (Signed)
    Return Prenatal Note   Subjective  27 y.o. G2P1001 at [redacted]w[redacted]d presents for this follow-up prenatal visit. Pregnancy notable for hx of IUGR in prior pregnancy, varicella non-immune, and BMI.   Patient well today, has been feeling some dizziness and weakness. Morning sickness improving.   Patient reports: Movement: Present Contractions: Not present Denies vaginal bleeding or leaking fluid. Objective  Flow sheet Vitals: Pulse Rate: 86 BP: 100/60 Fetal Heart Rate (bpm): 157 Total weight gain: 5 lb (2.268 kg)  General Appearance  No acute distress, well appearing, and well nourished Pulmonary   Normal work of breathing Neurologic   Alert and oriented to person, place, and time Psychiatric   Mood and affect within normal limits  Assessment/Plan   Plan  27 y.o. G2P1001 at [redacted]w[redacted]d presents for follow-up OB visit. Reviewed prenatal record including previous visit note. 1. Supervision of other normal pregnancy, antepartum - Anatomy US ordered today -Discussed dizziness/weakness, ensure adequate hydration, regular small meals/snacks throughout the day, avoiding prolonged sitting or standing, and getting regular physical activity.   2. Maternal varicella, non-immune  3. Obesity (BMI 30-39.9) -Early glucola 96 -Limit weight gain during pregnancy to 11-20lbs total   Orders Placed This Encounter  Procedures   US OB Comp + 14 Wk    Standing Status:   Future    Standing Expiration Date:   10/20/2024    Order Specific Question:   Reason for Exam (SYMPTOM  OR DIAGNOSIS REQUIRED)    Answer:   Anatomy    Order Specific Question:   Preferred Imaging Location?    Answer:   Internal   Return in about 4 weeks (around 11/18/2023) for ROB.   Future Appointments  Date Time Provider Department Center  11/20/2023  8:15 AM [facility] [place] [facility] None  11/20/2023  9:35 AM Dominica Severin, CNM [facility] None   For next visit:  continue with routine prenatal care with anatomy US    Julieanne Manson, DO [facility]

## 2023-11-20 ENCOUNTER — Other Ambulatory Visit: Payer: Self-pay | Admitting: Obstetrics

## 2023-11-20 ENCOUNTER — Ambulatory Visit (INDEPENDENT_AMBULATORY_CARE_PROVIDER_SITE_OTHER): Payer: Medicaid Other

## 2023-11-20 ENCOUNTER — Telehealth: Payer: Self-pay

## 2023-11-20 ENCOUNTER — Ambulatory Visit (INDEPENDENT_AMBULATORY_CARE_PROVIDER_SITE_OTHER): Payer: Medicaid Other | Admitting: Certified Nurse Midwife

## 2023-11-20 VITALS — BP 113/67 | HR 80 | Wt 216.2 lb

## 2023-11-20 DIAGNOSIS — E669 Obesity, unspecified: Secondary | ICD-10-CM

## 2023-11-20 DIAGNOSIS — Z3A19 19 weeks gestation of pregnancy: Secondary | ICD-10-CM | POA: Diagnosis not present

## 2023-11-20 DIAGNOSIS — Z363 Encounter for antenatal screening for malformations: Secondary | ICD-10-CM

## 2023-11-20 DIAGNOSIS — Z3480 Encounter for supervision of other normal pregnancy, unspecified trimester: Secondary | ICD-10-CM

## 2023-11-20 DIAGNOSIS — O09899 Supervision of other high risk pregnancies, unspecified trimester: Secondary | ICD-10-CM

## 2023-11-20 LAB — POCT URINALYSIS DIPSTICK
Bilirubin, UA: NEGATIVE
Blood, UA: NEGATIVE
Glucose, UA: NEGATIVE
Ketones, UA: NEGATIVE
Leukocytes, UA: NEGATIVE
Nitrite, UA: NEGATIVE
Protein, UA: NEGATIVE
Spec Grav, UA: 1.015 (ref 1.010–1.025)
Urobilinogen, UA: 0.2 U/dL
pH, UA: 6.5 (ref 5.0–8.0)

## 2023-11-20 NOTE — Patient Instructions (Signed)

## 2023-11-20 NOTE — Telephone Encounter (Signed)
Patient states she just left from an appointment. She has questions about her placenta placement that Shanda Bumps discussed with her. She can't remember if it was anterior (in front of) or posterior (behind) the baby. Chart reviewed. Advised her placenta is posterior per today's ultrasound report.

## 2023-11-20 NOTE — Progress Notes (Signed)
    Return Prenatal Note   Subjective   27 y.o. G2P1001 at [redacted]w[redacted]d presents for this follow-up prenatal visit.  Patient reports nausea improved, feeling movement. Anatomy ultrasound today incomplete for spine & heart views. Patient reports: Movement: Present Contractions: Not present  Objective   Flow sheet Vitals: Pulse Rate: 80 BP: 113/67 Fundal Height:  (@U ) Fetal Heart Rate (bpm): 155 Total weight gain: 6 lb 3.2 oz (2.812 kg)  General Appearance  No acute distress, well appearing, and well nourished Pulmonary   Normal work of breathing Neurologic   Alert and oriented to person, place, and time Psychiatric   Mood and affect within normal limits  Assessment/Plan   Plan  27 y.o. G2P1001 at [redacted]w[redacted]d presents for follow-up OB visit. Reviewed prenatal record including previous visit note.  Supervision of other normal pregnancy, antepartum Red flag warning signs reviewed. Anticipatory guidance for upcoming prenatal care discussed.      Orders Placed This Encounter  Procedures   US OB Follow Up    Standing Status:   Future    Expected Date:   12/21/2023    Expiration Date:   02/18/2024    Reason for exam::   incomplete anatomy for spine & heart views    Preferred imaging location?:   Internal   POCT Urinalysis Dipstick   Return in 4 weeks (on 12/18/2023) for ROB.   Future Appointments  Date Time Provider Department Center  12/18/2023  9:15 AM [facility] [place] [facility] None  12/18/2023 10:15 AM Free, Lindalou Hose, CNM [facility] None    For next visit:  continue with routine prenatal care     Dominica Severin, CNM  11/19/2710:52 AM

## 2023-11-20 NOTE — Assessment & Plan Note (Signed)
Red flag warning signs reviewed. Anticipatory guidance for upcoming prenatal care discussed.

## 2023-11-23 ENCOUNTER — Encounter: Payer: Self-pay | Admitting: Obstetrics

## 2023-11-25 NOTE — L&D Delivery Note (Signed)
 Delivery Note   Morgan Young is a 28 y.o. G2P1001 at [redacted]w[redacted]d Estimated Date of Delivery: 04/09/24  PRE-OPERATIVE DIAGNOSIS:  1) [redacted]w[redacted]d pregnancy.    POST-OPERATIVE DIAGNOSIS:  1) [redacted]w[redacted]d pregnancy s/p Vaginal, Vacuum (Extractor)   Delivery Type: Vaginal, Vacuum (Extractor)   Delivery Anesthesia: Epidural  Labor Complications: Prolonged second stage    ESTIMATED BLOOD LOSS: 500 ml    FINDINGS:   1) female infant, "[place], "Apgar scores of 8   at 1 minute and 9   at 5 minutes and a birthweight of 8 lbs., 4 ounces.     SPECIMENS:   PLACENTA:   Appearance: Intact   Removal: Spontaneous     Disposition:Per protocol  CORD BLOOD: Not Indicated  DISPOSITION:  Infant left in stable condition in the delivery room, with L&D personnel and mother.  NARRATIVE SUMMARY: Labor course:  Morgan Young is a G2P1001 at [redacted]w[redacted]d who presented to Labor & Delivery for induction of labor. Her initial cervical exam was 3/70/-2. Labor proceeded with augmentation, and she was found to be completely dilated at 1843. She made slow descent over several hours. Dr. Luster Salters was consulted for an operative delivery.  A vacuum delivery was performed (see note), and Morgan Young birthed a viable female infant at 2303. There was not a nuchal cord. The shoulders were birthed without difficulty with the assistance of the father of the baby. The infant was placed skin-to-skin with Morgan Young. The cord was doubly clamped and cut by the father when pulsations ceased. The placenta delivered spontaneously and was noted to be intact with a 3VC. A perineal and vaginal examination was performed. Episiotomy/Lacerations: none  Brisk bleeding was noted immediately following the placenta. Pitocin bolus was given and vigorous fundal massage was performed. Methergine 0.2 mg IM was given. Fundus was then firm and bleeding minimal. Morgan Young tolerated this well. Mother and baby were left in stable condition.   Josue Nip,  CNM 04/16/2024 12:13 AM

## 2023-12-14 ENCOUNTER — Other Ambulatory Visit: Payer: Self-pay | Admitting: Certified Nurse Midwife

## 2023-12-14 DIAGNOSIS — O99212 Obesity complicating pregnancy, second trimester: Secondary | ICD-10-CM

## 2023-12-14 DIAGNOSIS — Z3A19 19 weeks gestation of pregnancy: Secondary | ICD-10-CM

## 2023-12-14 DIAGNOSIS — Z362 Encounter for other antenatal screening follow-up: Secondary | ICD-10-CM

## 2023-12-18 ENCOUNTER — Ambulatory Visit (INDEPENDENT_AMBULATORY_CARE_PROVIDER_SITE_OTHER): Payer: Medicaid Other

## 2023-12-18 VITALS — BP 111/77 | HR 89 | Wt 215.0 lb

## 2023-12-18 DIAGNOSIS — Z3A23 23 weeks gestation of pregnancy: Secondary | ICD-10-CM | POA: Diagnosis not present

## 2023-12-18 DIAGNOSIS — E669 Obesity, unspecified: Secondary | ICD-10-CM

## 2023-12-18 DIAGNOSIS — Z362 Encounter for other antenatal screening follow-up: Secondary | ICD-10-CM | POA: Diagnosis not present

## 2023-12-18 DIAGNOSIS — O99212 Obesity complicating pregnancy, second trimester: Secondary | ICD-10-CM | POA: Diagnosis not present

## 2023-12-18 DIAGNOSIS — Z3480 Encounter for supervision of other normal pregnancy, unspecified trimester: Secondary | ICD-10-CM

## 2023-12-18 DIAGNOSIS — O09299 Supervision of pregnancy with other poor reproductive or obstetric history, unspecified trimester: Secondary | ICD-10-CM

## 2023-12-18 DIAGNOSIS — F320 Major depressive disorder, single episode, mild: Secondary | ICD-10-CM

## 2023-12-18 DIAGNOSIS — Z3482 Encounter for supervision of other normal pregnancy, second trimester: Secondary | ICD-10-CM

## 2023-12-18 NOTE — Progress Notes (Signed)
    Return Prenatal Note   Assessment/Plan   Plan  28 y.o. G2P1001 at [redacted]w[redacted]d presents for follow-up OB visit. Reviewed prenatal record including previous visit note.  Supervision of other normal pregnancy, antepartum - Follow up anatomy US today, complete report not yet available. Per ultrasound tech, unable to see RVOT, needs follow up ultrasound. Normal growth and placenta.   - Prepared for 1 hour glucola, third trimester labs, and Tdap at next visit.  -Reviewed red flag warning signs anticipatory guidance for upcoming prenatal care.    Orders Placed This Encounter  Procedures   US OB Follow Up    Standing Status:   Future    Expected Date:   01/18/2024    Expiration Date:   03/17/2024    Reason for exam::   Incomplete anatomy    Preferred imaging location?:   Internal   Return in about 4 weeks (around 01/15/2024) for ROB with 1 hour glucola.   Future Appointments  Date Time Provider Department Center  12/18/2023 10:15 AM Rustin Erhart, Lindalou Hose, CNM [facility] None    For next visit:  ROB with 1 hour gluocla, third trimester labs, and Tdap     Subjective   28 y.o. G2P1001 at [redacted]w[redacted]d presents for this follow-up prenatal visit.  Patient felt nauseous during ultrasound, but feeling better now after sitting up.  Patient reports: Movement: Present Contractions: Not present  Objective   Flow sheet Vitals: Pulse Rate: 89 BP: 111/77 Fundal Height: 24 cm Fetal Heart Rate (bpm): 151 by [place] Total weight gain: 5 lb (2.268 kg)  General Appearance  No acute distress, well appearing, and well nourished Pulmonary   Normal work of breathing Neurologic   Alert and oriented to person, place, and time Psychiatric   Mood and affect within normal limits  Lindalou Hose Wolfgang Finigan, CNM  01/24/259:52 AM

## 2023-12-18 NOTE — Assessment & Plan Note (Signed)
-   Follow up anatomy US today, complete report not yet available. Per ultrasound tech, unable to see RVOT, needs follow up ultrasound. Normal growth and placenta.   - Prepared for 1 hour glucola, third trimester labs, and Tdap at next visit.  -Reviewed red flag warning signs anticipatory guidance for upcoming prenatal care.

## 2023-12-22 NOTE — Telephone Encounter (Signed)
Patient states she missed a call from [place]. Advised the reason for the call was to notify her of follow up anatomy scan on 01/20/24 @8 :15 am. Patient advised this date/time is fine.

## 2024-01-13 ENCOUNTER — Other Ambulatory Visit: Payer: Self-pay

## 2024-01-13 DIAGNOSIS — Z113 Encounter for screening for infections with a predominantly sexual mode of transmission: Secondary | ICD-10-CM

## 2024-01-13 DIAGNOSIS — D649 Anemia, unspecified: Secondary | ICD-10-CM

## 2024-01-13 DIAGNOSIS — Z131 Encounter for screening for diabetes mellitus: Secondary | ICD-10-CM

## 2024-01-15 ENCOUNTER — Other Ambulatory Visit: Payer: Medicaid Other

## 2024-01-15 ENCOUNTER — Encounter: Payer: Self-pay | Admitting: Advanced Practice Midwife

## 2024-01-15 ENCOUNTER — Ambulatory Visit (INDEPENDENT_AMBULATORY_CARE_PROVIDER_SITE_OTHER): Payer: Medicaid Other | Admitting: Advanced Practice Midwife

## 2024-01-15 VITALS — BP 100/60 | Wt 222.0 lb

## 2024-01-15 DIAGNOSIS — Z3482 Encounter for supervision of other normal pregnancy, second trimester: Secondary | ICD-10-CM | POA: Diagnosis not present

## 2024-01-15 DIAGNOSIS — Z23 Encounter for immunization: Secondary | ICD-10-CM

## 2024-01-15 DIAGNOSIS — E669 Obesity, unspecified: Secondary | ICD-10-CM

## 2024-01-15 DIAGNOSIS — Z3A27 27 weeks gestation of pregnancy: Secondary | ICD-10-CM | POA: Diagnosis not present

## 2024-01-15 DIAGNOSIS — Z3480 Encounter for supervision of other normal pregnancy, unspecified trimester: Secondary | ICD-10-CM

## 2024-01-15 NOTE — Patient Instructions (Signed)
 Third Trimester of Pregnancy  The third trimester of pregnancy is from week 28 through week 40. This is months 7 through 9. The third trimester is a time when your baby is growing fast. Body changes during your third trimester Your body continues to change during this time. The changes usually go away after your baby is born. Physical changes You will continue to gain weight. You may get stretch marks on your hips, belly, and breasts. Your breasts will keep growing and may hurt. A yellow fluid (colostrum) may leak from your breasts. This is the first milk you're making for your baby. Your hair may grow faster and get thicker. In some cases, you may get hair loss. Your belly button may stick out. You may have more swelling in your hands, face, or ankles. Health changes You may have heartburn. You may feel short of breath. This is caused by the uterus that is now bigger. You may have more aches in the pelvis, back, or thighs. You may have more tingling or numbness in your hands, arms, and legs. You may pee more often. You may have trouble pooping (constipation) or swollen veins in the butt that can itch or get painful (hemorrhoids). Other changes You may have more problems sleeping. You may notice the baby moving lower in your belly (dropping). You may have more fluid coming from your vagina. Your joints may feel loose, and you may have pain around your pelvic bone. Follow these instructions at home: Medicines Take medicines only as told by your health care provider. Some medicines are not safe during pregnancy. Your provider may change the medicines that you take. Do not take any medicines unless told to by your provider. Take a prenatal vitamin that has at least 600 micrograms (mcg) of folic acid. Do not use herbal medicines, illegal drugs, or medicines that are not approved by your provider. Eating and drinking While you're pregnant your body needs additional nutrition to help  support your growing baby. Talk with your provider about your nutritional needs. Activity Most women are able to exercise regularly during pregnancy. Exercise routines may need to change at the end of your pregnancy. Talk to your provider about your activities and exercise routine. Relieving pain and discomfort Rest often with your legs raised if you have leg cramps or low back pain. Take warm sitz baths to soothe pain from hemorrhoids. Use hemorrhoid cream if your provider says it's okay. Wear a good, supportive bra if your breasts hurt. Do not use hot tubs, steam rooms, or saunas. Do not douche. Do not use tampons or scented pads. Safety Talk to your provider before traveling far distances. Wear your seatbelt at all times when you're in a car. Talk to your provider if someone hits you, hurts you, or yells at you. Preparing for birth To prepare for your baby: Take childbirth and breastfeeding classes. Visit the hospital and tour the maternity area. Buy a rear-facing car seat. Learn how to install it in your car. General instructions Avoid cat litter boxes and soil used by cats. These things carry germs that can cause harm to your pregnancy and your baby. Do not drink alcohol, smoke, vape, or use products with nicotine or tobacco in them. If you need help quitting, talk with your provider. Keep all follow-up visits for your third trimester. Your provider will do more exams and tests during this trimester. Write down your questions. Take them to your prenatal visits. Your provider also will: Talk with you about  your overall health. Give you advice or refer you to specialists who can help with different needs, including: Mental health and counseling. Foods and healthy eating. Ask for help if you need help with food. Where to find more information American Pregnancy Association: americanpregnancy.org [employer]: acog.org Office on [employer] Health:  [employer] Contact a health care provider if: You have a headache that does not go away when you take medicine. You have any of these problems: You can't eat or drink. You have nausea and vomiting. You have watery poop (diarrhea) for 2 days or more. You have pain when you pee, or your pee smells bad. You have been sick for 2 days or more and aren't getting better. Contact your provider right away if: You have any of these coming from your vagina: Abnormal discharge. Bad-smelling fluid. Bleeding. Your baby is moving less than usual. You have signs of labor: You have any contractions, belly cramping, or have pain in your pelvis or lower back before 37 weeks of pregnancy (preterm labor). You have regular contractions that are less than 5 minutes apart. Your water breaks. You have symptoms of high blood pressure or preeclampsia. These include: A severe, throbbing headache that does not go away. Sudden or extreme swelling of your face, hands, legs, or feet. Vision problems: You see spots. You have blurry vision. Your eyes are sensitive to light. If you can't reach your provider, go to an urgent care or emergency room. Get help right away if: You faint, become confused, or can't think clearly. You have chest pain or trouble breathing. You have any kind of injury, such as from a fall or a car crash. These symptoms may be an emergency. Call 911 right away. Do not wait to see if the symptoms will go away. Do not drive yourself to the hospital. This information is not intended to replace advice given to you by your health care provider. Make sure you discuss any questions you have with your health care provider. Document Revised: 08/13/2023 Document Reviewed: 03/13/2023 Elsevier Patient Education  2024 [employer].

## 2024-01-15 NOTE — Progress Notes (Signed)
Routine Prenatal Care Visit  Subjective  ROSARY Morgan Young is a 28 y.o. G2P1001 at [redacted]w[redacted]d being seen today for ongoing prenatal care.  She is currently monitored for the following issues for this low-risk pregnancy and has Obesity (BMI 30-39.9); Seasonal allergies; Nut allergy; Depression, major, single episode, mild (HCC); Supervision of other normal pregnancy, antepartum; IUGR (intrauterine growth restriction) in prior pregnancy, pregnant; Elevated hemoglobin A1c; and Maternal varicella, non-immune on their problem list.  ----------------------------------------------------------------------------------- Patient reports feeling weak and tired/light headed at times. She also has some pelvic cramping. Recommend increase hydration and protein with each meal/snack.   Contractions: Not present. Vag. Bleeding: None.  Movement: Present. Leaking Fluid denies.  ----------------------------------------------------------------------------------- The following portions of the patient's history were reviewed and updated as appropriate: allergies, current medications, past family history, past medical history, past social history, past surgical history and problem list. Problem list updated.  Objective  Blood pressure 100/60, weight 222 lb (100.7 kg), last menstrual period 07/06/2023. Pregravid weight 210 lb (95.3 kg) Total Weight Gain 12 lb (5.443 kg) Urinalysis: Urine Protein    Urine Glucose    Fetal Status: Fetal Heart Rate (bpm): 146 Fundal Height: 28 cm Movement: Present     General:  Alert, oriented and cooperative. Patient is in no acute distress.  Skin: Skin is warm and dry. No rash noted.   Cardiovascular: Normal heart rate noted  Respiratory: Normal respiratory effort, no problems with respiration noted  Abdomen: Soft, gravid, appropriate for gestational age. Pain/Pressure: Absent     Pelvic:  Cervical exam deferred        Extremities: Normal range of motion.  Edema: None  Mental Status: Normal  mood and affect. Normal behavior. Normal judgment and thought content.   Assessment   28 y.o. G2P1001 at [redacted]w[redacted]d by  04/09/2024, by Ultrasound presenting for routine prenatal visit  Plan   second Problems (from 08/20/23 to present)     Problem Noted Diagnosed Resolved   IUGR (intrauterine growth restriction) in prior pregnancy, pregnant 09/25/2023 by Dominica Severin, CNM  No   Overview Signed 09/25/2023 12:31 PM by Dominica Severin, CNM  G1-IOL at 36w, 4#2oz      Supervision of other normal pregnancy, antepartum 08/20/2023 by Loran Senters, CMA  No   Overview Addendum 10/21/2023  9:23 AM by Julieanne Manson, MD   Clinical Staff Provider  Office Location  [facility] Dating  08/14/23: [redacted]w[redacted]d; low FHR;   Language  English Anatomy US    Flu Vaccine  declined Genetic Screen  NIPS: low risk, XX  TDaP vaccine   offer Hgb A1C or  GTT Early :5.8% / 1hGTT 96 Third trimester :   Covid declined   LAB RESULTS   Rhogam  A/Positive/-- (11/01 1108)  Blood Type A/Positive/-- (11/01 1108)   RSV  Antibody Negative (11/01 1108)  Feeding Plan breast Rubella 1.26 (11/01 1108)  Contraception BTL RPR Non Reactive (11/01 1108)   Circumcision yes HBsAg Negative (11/01 1108)   Pediatrician  Burl; Ped HIV Non Reactive (11/01 1108)  Support Person Terence Varicella Non Reactive (11/01 1108)  Prenatal Classes no GBS  (For PCN allergy, check sensitivities)     Hep C Non Reactive (11/01 1108) Non reactive 2021  BTL Consent  Pap Diagnosis  Date Value Ref Range Status  09/25/2023   Final   - Negative for intraepithelial lesion or malignancy (NILM)    VBAC Consent  Hgb Electro      CF      SMA  Free T4 1 2021 TSH 3.510 2021              Preterm labor symptoms and general obstetric precautions including but not limited to vaginal bleeding, contractions, leaking of fluid and fetal movement were reviewed in detail with the patient. Please refer to After Visit Summary for other counseling  recommendations.   Return in about 2 weeks (around 01/29/2024) for rob.  Tresea Mall, CNM 01/15/2024 9:58 AM

## 2024-01-15 NOTE — Addendum Note (Signed)
Addended by: Donnetta Hail on: 01/15/2024 10:19 AM   Modules accepted: Orders

## 2024-01-15 NOTE — Progress Notes (Signed)
RBO- 1 hr GTT today, TDAP & BT consent signed today, no concerns.

## 2024-01-16 LAB — 28 WEEK RH+PANEL
Basophils Absolute: 0 10*3/uL (ref 0.0–0.2)
Basos: 0 %
EOS (ABSOLUTE): 0.2 10*3/uL (ref 0.0–0.4)
Eos: 3 %
Gestational Diabetes Screen: 130 mg/dL (ref 70–139)
HIV Screen 4th Generation wRfx: NONREACTIVE
Hematocrit: 32.9 % — ABNORMAL LOW (ref 34.0–46.6)
Hemoglobin: 10.9 g/dL — ABNORMAL LOW (ref 11.1–15.9)
Immature Grans (Abs): 0 10*3/uL (ref 0.0–0.1)
Immature Granulocytes: 0 %
Lymphocytes Absolute: 1.5 10*3/uL (ref 0.7–3.1)
Lymphs: 19 %
MCH: 29.9 pg (ref 26.6–33.0)
MCHC: 33.1 g/dL (ref 31.5–35.7)
MCV: 90 fL (ref 79–97)
Monocytes Absolute: 0.4 10*3/uL (ref 0.1–0.9)
Monocytes: 6 %
Neutrophils Absolute: 5.7 10*3/uL (ref 1.4–7.0)
Neutrophils: 72 %
Platelets: 256 10*3/uL (ref 150–450)
RBC: 3.64 x10E6/uL — ABNORMAL LOW (ref 3.77–5.28)
RDW: 13.7 % (ref 11.7–15.4)
RPR Ser Ql: NONREACTIVE
WBC: 7.9 10*3/uL (ref 3.4–10.8)

## 2024-01-18 ENCOUNTER — Other Ambulatory Visit: Payer: Self-pay

## 2024-01-18 DIAGNOSIS — E669 Obesity, unspecified: Secondary | ICD-10-CM

## 2024-01-18 DIAGNOSIS — O99019 Anemia complicating pregnancy, unspecified trimester: Secondary | ICD-10-CM | POA: Insufficient documentation

## 2024-01-18 DIAGNOSIS — O99013 Anemia complicating pregnancy, third trimester: Secondary | ICD-10-CM

## 2024-01-18 DIAGNOSIS — O99213 Obesity complicating pregnancy, third trimester: Secondary | ICD-10-CM

## 2024-01-18 DIAGNOSIS — F320 Major depressive disorder, single episode, mild: Secondary | ICD-10-CM

## 2024-01-18 DIAGNOSIS — Z362 Encounter for other antenatal screening follow-up: Secondary | ICD-10-CM

## 2024-01-18 DIAGNOSIS — O09299 Supervision of pregnancy with other poor reproductive or obstetric history, unspecified trimester: Secondary | ICD-10-CM

## 2024-01-18 DIAGNOSIS — Z3480 Encounter for supervision of other normal pregnancy, unspecified trimester: Secondary | ICD-10-CM

## 2024-01-18 MED ORDER — FERROUS SULFATE 325 (65 FE) MG PO TABS: 325.0000 mg | ORAL_TABLET | ORAL | 4 refills | Status: DC

## 2024-01-19 ENCOUNTER — Other Ambulatory Visit: Payer: Medicaid Other

## 2024-01-19 ENCOUNTER — Other Ambulatory Visit: Payer: Self-pay

## 2024-01-19 DIAGNOSIS — E669 Obesity, unspecified: Secondary | ICD-10-CM

## 2024-01-19 DIAGNOSIS — O99213 Obesity complicating pregnancy, third trimester: Secondary | ICD-10-CM

## 2024-01-19 DIAGNOSIS — F320 Major depressive disorder, single episode, mild: Secondary | ICD-10-CM

## 2024-01-19 DIAGNOSIS — O09299 Supervision of pregnancy with other poor reproductive or obstetric history, unspecified trimester: Secondary | ICD-10-CM

## 2024-01-19 DIAGNOSIS — Z362 Encounter for other antenatal screening follow-up: Secondary | ICD-10-CM

## 2024-01-19 DIAGNOSIS — Z3480 Encounter for supervision of other normal pregnancy, unspecified trimester: Secondary | ICD-10-CM

## 2024-01-20 ENCOUNTER — Ambulatory Visit: Payer: Medicaid Other

## 2024-01-20 DIAGNOSIS — O99213 Obesity complicating pregnancy, third trimester: Secondary | ICD-10-CM

## 2024-01-20 DIAGNOSIS — E669 Obesity, unspecified: Secondary | ICD-10-CM | POA: Diagnosis not present

## 2024-01-20 DIAGNOSIS — O09299 Supervision of pregnancy with other poor reproductive or obstetric history, unspecified trimester: Secondary | ICD-10-CM

## 2024-01-20 DIAGNOSIS — O09293 Supervision of pregnancy with other poor reproductive or obstetric history, third trimester: Secondary | ICD-10-CM | POA: Diagnosis not present

## 2024-01-20 DIAGNOSIS — Z3A28 28 weeks gestation of pregnancy: Secondary | ICD-10-CM

## 2024-01-20 DIAGNOSIS — Z362 Encounter for other antenatal screening follow-up: Secondary | ICD-10-CM | POA: Diagnosis not present

## 2024-01-29 ENCOUNTER — Ambulatory Visit: Payer: Medicaid Other | Admitting: Certified Nurse Midwife

## 2024-01-29 VITALS — BP 99/63 | HR 105 | Wt 222.0 lb

## 2024-01-29 DIAGNOSIS — E669 Obesity, unspecified: Secondary | ICD-10-CM

## 2024-01-29 DIAGNOSIS — O365930 Maternal care for other known or suspected poor fetal growth, third trimester, not applicable or unspecified: Secondary | ICD-10-CM

## 2024-01-29 DIAGNOSIS — O09299 Supervision of pregnancy with other poor reproductive or obstetric history, unspecified trimester: Secondary | ICD-10-CM

## 2024-01-29 DIAGNOSIS — Z3A29 29 weeks gestation of pregnancy: Secondary | ICD-10-CM

## 2024-01-29 DIAGNOSIS — Z3480 Encounter for supervision of other normal pregnancy, unspecified trimester: Secondary | ICD-10-CM

## 2024-01-29 MED ORDER — TRIAMCINOLONE ACETONIDE 0.1 % EX CREA: TOPICAL_CREAM | Freq: Two times a day (BID) | CUTANEOUS | Status: AC

## 2024-01-29 NOTE — Progress Notes (Signed)
    Return Prenatal Note   Assessment/Plan   Plan  28 y.o. G2P1001 at [redacted]w[redacted]d presents for follow-up OB visit. Reviewed prenatal record including previous visit note.  Supervision of other normal pregnancy, antepartum Follow up US still unable to see RVOT. Order sent for follow up US.  Reviewed 28 weeks lab results. Encouraged to pick up iron supplement and begin taking every other day. Triamcinolone cream refilled for excema.  Reviewed kick counts and preterm labor warning signs. Instructed to call office or come to hospital with persistent headache, vision changes, regular contractions, leaking of fluid, decreased fetal movement or vaginal bleeding.   IUGR (intrauterine growth restriction) in prior pregnancy, pregnant Reviewed last US showed 23% for this baby so we would not need to induce early unless something changes with fetal growth or maternal health condition.    Orders Placed This Encounter  Procedures   US OB Follow Up    Standing Status:   Future    Expected Date:   02/12/2024    Expiration Date:   04/30/2024    Reason for exam::   Additional views from anatomy US    Preferred imaging location?:   Internal   No follow-ups on file.   Future Appointments  Date Time Provider Department Center  02/12/2024 10:15 AM Tresea Mall, CNM [facility] None    For next visit:  continue with routine prenatal care     Subjective   28 y.o. G2P1001 at [redacted]w[redacted]d presents for this follow-up prenatal visit.  Patient has questions about future induction of labor  Patient reports: Movement: Present Contractions: Not present  Objective   Flow sheet Vitals: Pulse Rate: (!) 105 BP: 99/63 Total weight gain: 12 lb (5.443 kg)  General Appearance  No acute distress, well appearing, and well nourished Pulmonary   Normal work of breathing Neurologic   Alert and oriented to person, place, and time Psychiatric   Mood and affect within normal limits  Oley Balm, CNM  01/28/2509:10  AM

## 2024-01-29 NOTE — Assessment & Plan Note (Signed)
 Reviewed last US showed 23% for this baby so we would not need to induce early unless something changes with fetal growth or maternal health condition.

## 2024-01-29 NOTE — Assessment & Plan Note (Addendum)
 Follow up US still unable to see RVOT. Order sent for follow up US.  Reviewed 28 weeks lab results. Encouraged to pick up iron supplement and begin taking every other day. Triamcinolone cream refilled for excema.  Reviewed kick counts and preterm labor warning signs. Instructed to call office or come to hospital with persistent headache, vision changes, regular contractions, leaking of fluid, decreased fetal movement or vaginal bleeding.

## 2024-02-11 ENCOUNTER — Ambulatory Visit
Admission: RE | Admit: 2024-02-11 | Discharge: 2024-02-11 | Disposition: A | Discharge status: Home / Self Care | Source: Ambulatory Visit | Attending: Certified Nurse Midwife | Admitting: Certified Nurse Midwife

## 2024-02-11 DIAGNOSIS — Z3A32 32 weeks gestation of pregnancy: Secondary | ICD-10-CM | POA: Diagnosis not present

## 2024-02-11 DIAGNOSIS — O365930 Maternal care for other known or suspected poor fetal growth, third trimester, not applicable or unspecified: Secondary | ICD-10-CM | POA: Diagnosis present

## 2024-02-11 DIAGNOSIS — Z3689 Encounter for other specified antenatal screening: Secondary | ICD-10-CM | POA: Diagnosis not present

## 2024-02-11 DIAGNOSIS — O09299 Supervision of pregnancy with other poor reproductive or obstetric history, unspecified trimester: Secondary | ICD-10-CM | POA: Insufficient documentation

## 2024-02-11 DIAGNOSIS — Z3480 Encounter for supervision of other normal pregnancy, unspecified trimester: Secondary | ICD-10-CM | POA: Insufficient documentation

## 2024-02-11 DIAGNOSIS — E669 Obesity, unspecified: Secondary | ICD-10-CM | POA: Insufficient documentation

## 2024-02-12 ENCOUNTER — Encounter: Admitting: Advanced Practice Midwife

## 2024-02-12 ENCOUNTER — Telehealth: Payer: Self-pay | Admitting: Advanced Practice Midwife

## 2024-02-12 DIAGNOSIS — Z3480 Encounter for supervision of other normal pregnancy, unspecified trimester: Secondary | ICD-10-CM

## 2024-02-12 DIAGNOSIS — Z3A31 31 weeks gestation of pregnancy: Secondary | ICD-10-CM

## 2024-02-12 DIAGNOSIS — O09299 Supervision of pregnancy with other poor reproductive or obstetric history, unspecified trimester: Secondary | ICD-10-CM

## 2024-02-12 NOTE — Telephone Encounter (Signed)
 Pt was scheduled on 02/12/2024 at 10:15 with JEG.  Pt called in and rescheduled to 02/16/2024 at 8:55 with Robb Matar.

## 2024-02-15 ENCOUNTER — Telehealth: Payer: Self-pay

## 2024-02-15 NOTE — Telephone Encounter (Signed)
 Patient states for the past week she has been having numbness and tingling in her hands and sometimes her legs. She states it wakes her up out of her sleep and she is having difficulty sleeping/getting back to sleep. She denies swelling of legs. Advised of Carpel tunnel syndrome common in pregnancy and can use night splints and take tylenol. Advised can try tums or magnesium for leg cramps. Can try tylenol pm or unisom to help with sleep. Patient has routine prenatal appointment tomorrow and can discuss further if needed.

## 2024-02-16 ENCOUNTER — Ambulatory Visit (INDEPENDENT_AMBULATORY_CARE_PROVIDER_SITE_OTHER): Admitting: Certified Nurse Midwife

## 2024-02-16 ENCOUNTER — Encounter: Payer: Self-pay | Admitting: Certified Nurse Midwife

## 2024-02-16 VITALS — BP 112/70 | HR 89 | Wt 225.4 lb

## 2024-02-16 DIAGNOSIS — Z3480 Encounter for supervision of other normal pregnancy, unspecified trimester: Secondary | ICD-10-CM | POA: Diagnosis not present

## 2024-02-16 DIAGNOSIS — Z3A32 32 weeks gestation of pregnancy: Secondary | ICD-10-CM

## 2024-02-16 NOTE — Assessment & Plan Note (Signed)
 Reviewed kick counts and preterm labor warning signs. Instructed to call office or come to hospital with persistent headache, vision changes, regular contractions, leaking of fluid, decreased fetal movement or vaginal bleeding.

## 2024-02-16 NOTE — Patient Instructions (Signed)
 Third Trimester of Pregnancy  The third trimester of pregnancy is from week 28 through week 40. This is months 7 through 9. The third trimester is a time when your baby is growing fast. Body changes during your third trimester Your body continues to change during this time. The changes usually go away after your baby is born. Physical changes You will continue to gain weight. You may get stretch marks on your hips, belly, and breasts. Your breasts will keep growing and may hurt. A yellow fluid (colostrum) may leak from your breasts. This is the first milk you're making for your baby. Your hair may grow faster and get thicker. In some cases, you may get hair loss. Your belly button may stick out. You may have more swelling in your hands, face, or ankles. Health changes You may have heartburn. You may feel short of breath. This is caused by the uterus that is now bigger. You may have more aches in the pelvis, back, or thighs. You may have more tingling or numbness in your hands, arms, and legs. You may pee more often. You may have trouble pooping (constipation) or swollen veins in the butt that can itch or get painful (hemorrhoids). Other changes You may have more problems sleeping. You may notice the baby moving lower in your belly (dropping). You may have more fluid coming from your vagina. Your joints may feel loose, and you may have pain around your pelvic bone. Follow these instructions at home: Medicines Take medicines only as told by your health care provider. Some medicines are not safe during pregnancy. Your provider may change the medicines that you take. Do not take any medicines unless told to by your provider. Take a prenatal vitamin that has at least 600 micrograms (mcg) of folic acid. Do not use herbal medicines, illegal drugs, or medicines that are not approved by your provider. Eating and drinking While you're pregnant your body needs additional nutrition to help  support your growing baby. Talk with your provider about your nutritional needs. Activity Most women are able to exercise regularly during pregnancy. Exercise routines may need to change at the end of your pregnancy. Talk to your provider about your activities and exercise routine. Relieving pain and discomfort Rest often with your legs raised if you have leg cramps or low back pain. Take warm sitz baths to soothe pain from hemorrhoids. Use hemorrhoid cream if your provider says it's okay. Wear a good, supportive bra if your breasts hurt. Do not use hot tubs, steam rooms, or saunas. Do not douche. Do not use tampons or scented pads. Safety Talk to your provider before traveling far distances. Wear your seatbelt at all times when you're in a car. Talk to your provider if someone hits you, hurts you, or yells at you. Preparing for birth To prepare for your baby: Take childbirth and breastfeeding classes. Visit the hospital and tour the maternity area. Buy a rear-facing car seat. Learn how to install it in your car. General instructions Avoid cat litter boxes and soil used by cats. These things carry germs that can cause harm to your pregnancy and your baby. Do not drink alcohol, smoke, vape, or use products with nicotine or tobacco in them. If you need help quitting, talk with your provider. Keep all follow-up visits for your third trimester. Your provider will do more exams and tests during this trimester. Write down your questions. Take them to your prenatal visits. Your provider also will: Talk with you about  your overall health. Give you advice or refer you to specialists who can help with different needs, including: Mental health and counseling. Foods and healthy eating. Ask for help if you need help with food. Where to find more information American Pregnancy Association: americanpregnancy.org [employer]: acog.org Office on [employer] Health:  [employer] Contact a health care provider if: You have a headache that does not go away when you take medicine. You have any of these problems: You can't eat or drink. You have nausea and vomiting. You have watery poop (diarrhea) for 2 days or more. You have pain when you pee, or your pee smells bad. You have been sick for 2 days or more and aren't getting better. Contact your provider right away if: You have any of these coming from your vagina: Abnormal discharge. Bad-smelling fluid. Bleeding. Your baby is moving less than usual. You have signs of labor: You have any contractions, belly cramping, or have pain in your pelvis or lower back before 37 weeks of pregnancy (preterm labor). You have regular contractions that are less than 5 minutes apart. Your water breaks. You have symptoms of high blood pressure or preeclampsia. These include: A severe, throbbing headache that does not go away. Sudden or extreme swelling of your face, hands, legs, or feet. Vision problems: You see spots. You have blurry vision. Your eyes are sensitive to light. If you can't reach your provider, go to an urgent care or emergency room. Get help right away if: You faint, become confused, or can't think clearly. You have chest pain or trouble breathing. You have any kind of injury, such as from a fall or a car crash. These symptoms may be an emergency. Call 911 right away. Do not wait to see if the symptoms will go away. Do not drive yourself to the hospital. This information is not intended to replace advice given to you by your health care provider. Make sure you discuss any questions you have with your health care provider. Document Revised: 08/13/2023 Document Reviewed: 03/13/2023 Elsevier Patient Education  2024 [employer].

## 2024-02-16 NOTE — Progress Notes (Signed)
    Return Prenatal Note   Subjective   28 y.o. G2P1001 at [redacted]w[redacted]d presents for this follow-up prenatal visit.  Patient feeling well, active baby. Had ultrasound last week-not yet resulted. Desires permanent sterilization for contraception Patient reports: Movement: Present Contractions: Irritability  Objective   Flow sheet Vitals: Pulse Rate: 89 BP: 112/70 Fundal Height: 33 cm Fetal Heart Rate (bpm): 140 Presentation: Vertex ([place]) Total weight gain: 15 lb 6.4 oz (6.985 kg)  General Appearance  No acute distress, well appearing, and well nourished Pulmonary   Normal work of breathing Neurologic   Alert and oriented to person, place, and time Psychiatric   Mood and affect within normal limits  Assessment/Plan   Plan  28 y.o. G2P1001 at [redacted]w[redacted]d presents for follow-up OB visit. Reviewed prenatal record including previous visit note.  Supervision of other normal pregnancy, antepartum Reviewed kick counts and preterm labor warning signs. Instructed to call office or come to hospital with persistent headache, vision changes, regular contractions, leaking of fluid, decreased fetal movement or vaginal bleeding.    Recommend next visit with MD/DO to discuss postpartum tubal ligation, reviewed may not be possible during hospitalization at time of birth and need for consent to be signed at least 30d prior.  No orders of the defined types were placed in this encounter.  Return in 2 weeks (on 03/01/2024) for ROB.   Future Appointments  Date Time Provider Department Center  03/01/2024  9:35 AM Hildred Laser, MD [facility] None    For next visit:  continue with routine prenatal care     Dominica Severin, CNM  03/25/259:39 AM

## 2024-03-01 ENCOUNTER — Encounter: Payer: Self-pay | Admitting: Obstetrics and Gynecology

## 2024-03-01 ENCOUNTER — Ambulatory Visit (INDEPENDENT_AMBULATORY_CARE_PROVIDER_SITE_OTHER): Admitting: Obstetrics and Gynecology

## 2024-03-01 VITALS — BP 96/47 | HR 87 | Wt 226.0 lb

## 2024-03-01 DIAGNOSIS — Z308 Encounter for other contraceptive management: Secondary | ICD-10-CM

## 2024-03-01 DIAGNOSIS — Z8759 Personal history of other complications of pregnancy, childbirth and the puerperium: Secondary | ICD-10-CM

## 2024-03-01 DIAGNOSIS — E669 Obesity, unspecified: Secondary | ICD-10-CM | POA: Diagnosis not present

## 2024-03-01 DIAGNOSIS — Z3480 Encounter for supervision of other normal pregnancy, unspecified trimester: Secondary | ICD-10-CM

## 2024-03-01 NOTE — Progress Notes (Signed)
 ROB [redacted]w[redacted]d: She is doing well. She reports good fetal movement and has no new concerns today. Has been having some Braxton Hick's contractions. Would like a tubal ligation after she delivers.

## 2024-03-01 NOTE — Patient Instructions (Signed)
 Postpartum Tubal Ligation Postpartum tubal ligation (PPTL) is a procedure to close the fallopian tubes. This is done to prevent pregnancy. When the fallopian tubes are closed, the eggs that the ovaries release cannot enter the uterus, and sperm cannot reach the eggs. PPTL is done right after childbirth or 1-2 days after childbirth, before the uterus returns to its normal position. If you have a cesarean section, it can be performed at the same time as the procedure. Having this done after childbirth does not make your stay in the hospital longer. PPTL is sometimes called "getting your tubes tied." You should not have this procedure if you want to get pregnant again or if you are unsure about having more children. Tell a health care provider about: Any allergies you have. All medicines you are taking, including vitamins, herbs, eye drops, creams, and over-the-counter medicines. Any problems you or family members have had with anesthetic medicines. Any blood disorders you have. Any surgeries you have had. Any medical conditions you have or have had. Any past pregnancies. What are the risks? Generally, this is a safe procedure. However, problems may occur, including: Infection. Bleeding. Damage to other organs in the abdomen. Side effects from anesthetic medicines. Failure of the procedure. If this happens, you could get pregnant. Ectopic pregnancy. This is a pregnancy in which the egg attaches outside the uterus. What happens before the procedure? Ask your health care provider about: How much pain you can expect to have. What medicines you will be given for pain, especially if you are breastfeeding. What happens during the procedure? If you had a vaginal delivery: An IV will be inserted into one of your veins. You will be given one or more of the following: A medicine to help you relax (sedative). A medicine to numb the area (local anesthetic). A medicine to make you fall asleep (general  anesthetic). A medicine that is injected into an area of your body to numb everything below the injection site (regional anesthetic). If you have been given a general anesthetic, a tube will be put down your throat to help you breathe. Your bladder may be emptied with a small tube (catheter). An incision will be made just below your belly button. Your fallopian tubes will be located and brought up through the incision. Your fallopian tubes will be tied off, burned (cauterized), or blocked with a clip, ring, or clamp. A small part in the center of each fallopian tube may be removed. The incision will be closed with stitches (sutures). A bandage (dressing) will be placed over the incision. If you had a cesarean delivery: Tubal ligation will be done through the incision that was used for the cesarean delivery of your baby. The incision will be closed with sutures. A dressing will be placed over the incision. The procedure may vary among health care providers and hospitals. What happens after the procedure? Your blood pressure, heart rate, breathing rate, and blood oxygen level will be monitored until you leave the hospital. You will be given pain medicine as needed. You will be encouraged to get up early and walk to prevent blood clots. If you were given a sedative during the procedure, it can affect you for several hours. Do not drive or operate machinery until your health care provider says that it is safe. Summary Postpartum tubal ligation is a procedure that closes the fallopian tubes to prevent pregnancy. This procedure is done while you are still in the hospital after childbirth. If you have a cesarean  section, it can be performed at the same time. Having this done after childbirth does not make your stay in the hospital longer. Postpartum tubal ligation is considered permanent. You should not have this procedure if you want to get pregnant again or if you are unsure about having more  children. Talk to your health care provider to see if this procedure is right for you. This information is not intended to replace advice given to you by your health care provider. Make sure you discuss any questions you have with your health care provider. Document Revised: 07/26/2020 Document Reviewed: 07/27/2020 Elsevier Patient Education  2024 [employer].

## 2024-03-01 NOTE — Progress Notes (Signed)
 ROB: Patient is a 28 y.o. G2P1001 at [redacted]w[redacted]d who presents for routine OB care.  Pregnancy is complicated by: Obesity (BMI 30-39.9); Seasonal allergies; Nut allergy; Depression, major, single episode, mild (HCC); Supervision of other normal pregnancy, antepartum; IUGR (intrauterine growth restriction) in prior pregnancy, pregnant; Elevated hemoglobin A1c; Maternal varicella, non-immune; and Anemia affecting pregnancy.   Patient has complaints of pelvic pressure when walking. Discussed use of belly bands during this time. Denies vaginal bleeding, contractions, or LOF and endorses good fetal movement. States she feels that her baby may have changed positions, concerned for malpresentation. Leopold's maneuvers do indicate cephalic presentation, given reassurance.   Reviewed contraception, patient desires permanent sterilization.  Other reversible forms of contraception were discussed with patient; she declines all other modalities. Risks of procedure discussed with patient including but not limited to: risk of regret, permanence of method, bleeding, infection, injury to surrounding organs and need for additional procedures.  Failure risk of about 1% with increased risk of ectopic gestation if pregnancy occurs was also discussed with patient.  Also discussed possibility of post-tubal pain syndrome. Patient verbalized understanding of these risks.  Medicaid sterilization forms signed today. Also discussed possibility of interval tubal based on BMI and uterine location postpartum. Patient notes understanding. Also given handouts on other LARC options (as patient did mention that she and her husband are not quite in agreement as he thinks he desires 1 more). RTC in 2 weeks, for 36 week cultures at that time.

## 2024-03-14 ENCOUNTER — Telehealth: Payer: Self-pay

## 2024-03-14 NOTE — Telephone Encounter (Signed)
 Patient states she is [redacted] wks pregnant and has started losing her mucus plug. Advised this is normal and not a sign that labor is near. She has ROB appointment tomorrow. Advised will send information via my chart for her to review.

## 2024-03-15 ENCOUNTER — Ambulatory Visit (INDEPENDENT_AMBULATORY_CARE_PROVIDER_SITE_OTHER): Admitting: Certified Nurse Midwife

## 2024-03-15 ENCOUNTER — Other Ambulatory Visit (HOSPITAL_COMMUNITY)
Admission: RE | Admit: 2024-03-15 | Discharge: 2024-03-15 | Disposition: A | Discharge status: Home / Self Care | Source: Ambulatory Visit | Attending: Certified Nurse Midwife | Admitting: Certified Nurse Midwife

## 2024-03-15 VITALS — BP 110/72 | HR 78 | Wt 229.9 lb

## 2024-03-15 DIAGNOSIS — Z3A36 36 weeks gestation of pregnancy: Secondary | ICD-10-CM

## 2024-03-15 DIAGNOSIS — Z113 Encounter for screening for infections with a predominantly sexual mode of transmission: Secondary | ICD-10-CM | POA: Insufficient documentation

## 2024-03-15 DIAGNOSIS — Z3480 Encounter for supervision of other normal pregnancy, unspecified trimester: Secondary | ICD-10-CM

## 2024-03-15 DIAGNOSIS — Z3685 Encounter for antenatal screening for Streptococcus B: Secondary | ICD-10-CM | POA: Diagnosis not present

## 2024-03-15 NOTE — Progress Notes (Signed)
    Return Prenatal Note   Subjective   28 y.o. G2P1001 at [redacted]w[redacted]d presents for this follow-up prenatal visit.  Patient tired, occasional contractions. BTL consent signed with correct dates. Patient reports: Movement: Present Contractions: Irritability  Objective   Flow sheet Vitals: Pulse Rate: 78 BP: 110/72 Fundal Height: 36 cm Fetal Heart Rate (bpm): 140 Presentation: Vertex (confirmed on BSUS) Dilation: 1 Effacement (%): 50 Station: -2 Total weight gain: 19 lb 14.4 oz (9.027 kg)  General Appearance  No acute distress, well appearing, and well nourished Pulmonary   Normal work of breathing Neurologic   Alert and oriented to person, place, and time Psychiatric   Mood and affect within normal limits  Assessment/Plan   Plan  28 y.o. G2P1001 at [redacted]w[redacted]d presents for follow-up OB visit. Reviewed prenatal record including previous visit note.  Supervision of other normal pregnancy, antepartum Reviewed kick counts and preterm labor warning signs. Instructed to call office or come to hospital with persistent headache, vision changes, regular contractions, leaking of fluid, decreased fetal movement or vaginal bleeding. GBS & GC/Ct collected.    Orders Placed This Encounter  Procedures   Culture, beta strep (group b only)   Return in 1 week (on 03/22/2024) for ROB.   Future Appointments  Date Time Provider Department Center  03/21/2024  9:35 AM Slaughterbeck, Sherline Distel, CNM [facility] None    For next visit:  continue with routine prenatal care     Forestine Igo, CNM  04/22/259:59 AM

## 2024-03-15 NOTE — Patient Instructions (Signed)
 Third Trimester of Pregnancy  The third trimester of pregnancy is from week 28 through week 40. This is months 7 through 9. The third trimester is a time when your baby is growing fast. Body changes during your third trimester Your body continues to change during this time. The changes usually go away after your baby is born. Physical changes You will continue to gain weight. You may get stretch marks on your hips, belly, and breasts. Your breasts will keep growing and may hurt. A yellow fluid (colostrum) may leak from your breasts. This is the first milk you're making for your baby. Your hair may grow faster and get thicker. In some cases, you may get hair loss. Your belly button may stick out. You may have more swelling in your hands, face, or ankles. Health changes You may have heartburn. You may feel short of breath. This is caused by the uterus that is now bigger. You may have more aches in the pelvis, back, or thighs. You may have more tingling or numbness in your hands, arms, and legs. You may pee more often. You may have trouble pooping (constipation) or swollen veins in the butt that can itch or get painful (hemorrhoids). Other changes You may have more problems sleeping. You may notice the baby moving lower in your belly (dropping). You may have more fluid coming from your vagina. Your joints may feel loose, and you may have pain around your pelvic bone. Follow these instructions at home: Medicines Take medicines only as told by your health care provider. Some medicines are not safe during pregnancy. Your provider may change the medicines that you take. Do not take any medicines unless told to by your provider. Take a prenatal vitamin that has at least 600 micrograms (mcg) of folic acid. Do not use herbal medicines, illegal drugs, or medicines that are not approved by your provider. Eating and drinking While you're pregnant your body needs additional nutrition to help  support your growing baby. Talk with your provider about your nutritional needs. Activity Most women are able to exercise regularly during pregnancy. Exercise routines may need to change at the end of your pregnancy. Talk to your provider about your activities and exercise routine. Relieving pain and discomfort Rest often with your legs raised if you have leg cramps or low back pain. Take warm sitz baths to soothe pain from hemorrhoids. Use hemorrhoid cream if your provider says it's okay. Wear a good, supportive bra if your breasts hurt. Do not use hot tubs, steam rooms, or saunas. Do not douche. Do not use tampons or scented pads. Safety Talk to your provider before traveling far distances. Wear your seatbelt at all times when you're in a car. Talk to your provider if someone hits you, hurts you, or yells at you. Preparing for birth To prepare for your baby: Take childbirth and breastfeeding classes. Visit the hospital and tour the maternity area. Buy a rear-facing car seat. Learn how to install it in your car. General instructions Avoid cat litter boxes and soil used by cats. These things carry germs that can cause harm to your pregnancy and your baby. Do not drink alcohol, smoke, vape, or use products with nicotine or tobacco in them. If you need help quitting, talk with your provider. Keep all follow-up visits for your third trimester. Your provider will do more exams and tests during this trimester. Write down your questions. Take them to your prenatal visits. Your provider also will: Talk with you about  your overall health. Give you advice or refer you to specialists who can help with different needs, including: Mental health and counseling. Foods and healthy eating. Ask for help if you need help with food. Where to find more information American Pregnancy Association: americanpregnancy.org [employer]: acog.org Office on [employer] Health:  [employer] Contact a health care provider if: You have a headache that does not go away when you take medicine. You have any of these problems: You can't eat or drink. You have nausea and vomiting. You have watery poop (diarrhea) for 2 days or more. You have pain when you pee, or your pee smells bad. You have been sick for 2 days or more and aren't getting better. Contact your provider right away if: You have any of these coming from your vagina: Abnormal discharge. Bad-smelling fluid. Bleeding. Your baby is moving less than usual. You have signs of labor: You have any contractions, belly cramping, or have pain in your pelvis or lower back before 37 weeks of pregnancy (preterm labor). You have regular contractions that are less than 5 minutes apart. Your water breaks. You have symptoms of high blood pressure or preeclampsia. These include: A severe, throbbing headache that does not go away. Sudden or extreme swelling of your face, hands, legs, or feet. Vision problems: You see spots. You have blurry vision. Your eyes are sensitive to light. If you can't reach your provider, go to an urgent care or emergency room. Get help right away if: You faint, become confused, or can't think clearly. You have chest pain or trouble breathing. You have any kind of injury, such as from a fall or a car crash. These symptoms may be an emergency. Call 911 right away. Do not wait to see if the symptoms will go away. Do not drive yourself to the hospital. This information is not intended to replace advice given to you by your health care provider. Make sure you discuss any questions you have with your health care provider. Document Revised: 08/13/2023 Document Reviewed: 03/13/2023 Elsevier Patient Education  2024 [employer] Group B Streptococcus Infection During Pregnancy Group B Streptococcus (GBS) is a type of bacteria that is often found in healthy people. It is commonly found in the  rectum, vagina, and intestines. In people who are healthy and not pregnant, the bacteria rarely cause serious illness or complications. However, women who test positive for GBS during pregnancy can pass the bacteria to the baby during childbirth. This can cause serious infection in the baby after birth. Women with GBS may also have infections during their pregnancy or soon after childbirth. The infections include urinary tract infections (UTIs) or infections of the uterus. GBS also increases a woman's risk of complications during pregnancy, such as early labor or delivery, miscarriage, or stillbirth. Routine testing for GBS is recommended for all pregnant women. What are the causes? This condition is caused by bacteria called Streptococcus agalactiae. What increases the risk? You may have a higher risk for GBS infection during pregnancy if you had one during a past pregnancy. What are the signs or symptoms? In most cases, GBS infection does not cause symptoms in pregnant women. If symptoms exist, they may include: Labor that starts before the 37th week of pregnancy. A UTI or bladder infection. This may cause a fever, frequent urination, or pain and burning during urination. Fever during labor. There can also be a rapid heartbeat in the mother or baby. Rare but serious symptoms of a GBS  infection in women include: Blood infection (septicemia). This may cause fever, chills, or confusion. Lung infection (pneumonia). This may cause fever, chills, cough, rapid breathing, chest pain, or difficulty breathing. Bone, joint, skin, or soft tissue infection. How is this diagnosed? You may be screened for GBS between week 35 and week 37 of pregnancy. If you have symptoms of preterm labor, you may be screened earlier. This condition is diagnosed based on lab test results from: A swab of fluid from the vagina and rectum. A urine sample. How is this treated? This condition is treated with antibiotic medicine.  Antibiotic medicine may be given: To you when you go into labor, or as soon as your water breaks. The medicines will continue until after you give birth. If you are having a cesarean delivery, you do not need antibiotics unless your water has broken. To your baby, if he or she requires treatment. Your health care provider will check your baby to decide if he or she needs antibiotics to prevent a serious infection. Follow these instructions at home: Take over-the-counter and prescription medicines only as told by your health care provider. Take your antibiotic medicine as told by your health care provider. Do not stop taking the antibiotic even if you start to feel better. Keep all pre-birth (prenatal) visits and follow-up visits as told by your health care provider. This is important. Contact a health care provider if: You have pain or burning when you urinate. You have to urinate more often than usual. You have a fever or chills. You develop a bad-smelling vaginal discharge. Get help right away if: Your water breaks. You go into labor. You have severe pain in your abdomen. You have difficulty breathing. You have chest pain. These symptoms may represent a serious problem that is an emergency. Do not wait to see if the symptoms will go away. Get medical help right away. Call your local emergency services (911 in the [place] Do not drive yourself to the hospital. Summary GBS is a type of bacteria that is common in healthy people. During pregnancy, colonization with GBS can cause serious complications for you or your baby. Your health care provider will screen you between 35 and 37 weeks of pregnancy to determine if you are colonized with GBS. If you are colonized with GBS during pregnancy, your health care provider will recommend antibiotics through an IV during labor. After delivery, your baby will be evaluated for complications related to potential GBS infection and may require antibiotics to  prevent a serious infection. This information is not intended to replace advice given to you by your health care provider. Make sure you discuss any questions you have with your health care provider. Document Revised: 10/27/2022 Document Reviewed: 10/27/2022 Elsevier Patient Education  2024 [employer].

## 2024-03-15 NOTE — Assessment & Plan Note (Signed)
 Reviewed kick counts and preterm labor warning signs. Instructed to call office or come to hospital with persistent headache, vision changes, regular contractions, leaking of fluid, decreased fetal movement or vaginal bleeding.

## 2024-03-16 LAB — CERVICOVAGINAL ANCILLARY ONLY
Chlamydia: NEGATIVE
Comment: NEGATIVE
Comment: NORMAL
Neisseria Gonorrhea: NEGATIVE

## 2024-03-19 LAB — CULTURE, BETA STREP (GROUP B ONLY): Strep Gp B Culture: NEGATIVE

## 2024-03-21 ENCOUNTER — Ambulatory Visit (INDEPENDENT_AMBULATORY_CARE_PROVIDER_SITE_OTHER): Admitting: Certified Nurse Midwife

## 2024-03-21 VITALS — BP 112/66 | HR 73 | Wt 236.1 lb

## 2024-03-21 DIAGNOSIS — Z3480 Encounter for supervision of other normal pregnancy, unspecified trimester: Secondary | ICD-10-CM

## 2024-03-21 NOTE — Assessment & Plan Note (Signed)
 SVE today per patient request. 1/50/-2. Ready for baby! Reviewed what spontanous labor looks like since she had an induction last time.  Reviewed labor warning signs and expectations for birth. Instructed to call office or come to hospital with persistent headache, vision changes, regular contractions, leaking of fluid, decreased fetal movement or vaginal bleeding.

## 2024-03-21 NOTE — Progress Notes (Signed)
    Return Prenatal Note   Assessment/Plan   Plan  28 y.o. G2P1001 at [redacted]w[redacted]d presents for follow-up OB visit. Reviewed prenatal record including previous visit note.  Supervision of other normal pregnancy, antepartum SVE today per patient request. 1/50/-2. Ready for baby! Reviewed what spontanous labor looks like since she had an induction last time.  Reviewed labor warning signs and expectations for birth. Instructed to call office or come to hospital with persistent headache, vision changes, regular contractions, leaking of fluid, decreased fetal movement or vaginal bleeding.     No orders of the defined types were placed in this encounter.  No follow-ups on file.   No future appointments.  For next visit:  continue with routine prenatal care     Subjective   28 y.o. G2P1001 at [redacted]w[redacted]d presents for this follow-up prenatal visit.  Patient has no concerns today Patient reports: Movement: Present Contractions: Irregular  Objective   Flow sheet Vitals: Pulse Rate: 73 BP: 112/66 Fundal Height: 37 cm Fetal Heart Rate (bpm): 150 Presentation: Vertex Dilation: 1 Effacement (%): 50 Station: -2 Total weight gain: 26 lb 1.6 oz (11.8 kg)  General Appearance  No acute distress, well appearing, and well nourished Pulmonary   Normal work of breathing Neurologic   Alert and oriented to person, place, and time Psychiatric   Mood and affect within normal limits  Donato Fu, CNM  03/21/2509:04 AM

## 2024-03-30 ENCOUNTER — Ambulatory Visit (INDEPENDENT_AMBULATORY_CARE_PROVIDER_SITE_OTHER): Admitting: Certified Nurse Midwife

## 2024-03-30 VITALS — BP 115/67 | HR 76 | Wt 238.1 lb

## 2024-03-30 DIAGNOSIS — Z3480 Encounter for supervision of other normal pregnancy, unspecified trimester: Secondary | ICD-10-CM

## 2024-03-30 DIAGNOSIS — E669 Obesity, unspecified: Secondary | ICD-10-CM

## 2024-03-30 NOTE — Progress Notes (Signed)
    Return Prenatal Note   Assessment/Plan   Plan  28 y.o. G2P1001 at [redacted]w[redacted]d presents for follow-up OB visit. Reviewed prenatal record including previous visit note.  Supervision of other normal pregnancy, antepartum Ready for baby! Feeling some irregular contractions. Reviewed recommendation for 41 week IOL. Will consider and discuss at next visit. Reviewed kick counts and preterm labor warning signs. Instructed to call office or come to hospital with persistent headache, vision changes, regular contractions, leaking of fluid, decreased fetal movement or vaginal bleeding.    No orders of the defined types were placed in this encounter.  No follow-ups on file.   Future Appointments  Date Time Provider Department Center  04/06/2024  9:35 AM Free, Verita Glassman, CNM [facility] None    For next visit:  continue with routine prenatal care     Subjective   28 y.o. G2P1001 at [redacted]w[redacted]d presents for this follow-up prenatal visit.  Patient has no concerns today Patient reports: Movement: Present Contractions: Irregular  Objective   Flow sheet Vitals: Pulse Rate: 76 BP: 115/67 Fundal Height: 38 cm Fetal Heart Rate (bpm): 150 Presentation: Vertex ([place]) Total weight gain: 28 lb 1.6 oz (12.7 kg)  General Appearance  No acute distress, well appearing, and well nourished Pulmonary   Normal work of breathing Neurologic   Alert and oriented to person, place, and time Psychiatric   Mood and affect within normal limits  Donato Fu, CNM  05/07/259:56 AM

## 2024-03-30 NOTE — Assessment & Plan Note (Signed)
 Ready for baby! Feeling some irregular contractions. Reviewed recommendation for 41 week IOL. Will consider and discuss at next visit. Reviewed kick counts and preterm labor warning signs. Instructed to call office or come to hospital with persistent headache, vision changes, regular contractions, leaking of fluid, decreased fetal movement or vaginal bleeding.

## 2024-04-06 ENCOUNTER — Ambulatory Visit (INDEPENDENT_AMBULATORY_CARE_PROVIDER_SITE_OTHER)

## 2024-04-06 VITALS — BP 118/80 | HR 79 | Wt 236.0 lb

## 2024-04-06 DIAGNOSIS — J302 Other seasonal allergic rhinitis: Secondary | ICD-10-CM | POA: Diagnosis not present

## 2024-04-06 DIAGNOSIS — Z3A39 39 weeks gestation of pregnancy: Secondary | ICD-10-CM | POA: Diagnosis not present

## 2024-04-06 DIAGNOSIS — Z3480 Encounter for supervision of other normal pregnancy, unspecified trimester: Secondary | ICD-10-CM

## 2024-04-06 NOTE — Assessment & Plan Note (Signed)
-   Reviewed recommendations for induction at 41 weeks, scheduled for 5/23 @ 0500. - Plan for NST with ROB next week.  - Membrane sweep today. 2-3/50/-3 - Reviewed labor warning signs and expectations for birth. Instructed to call office or come to hospital with persistent headache, vision changes, regular contractions, leaking of fluid, decreased fetal movement or vaginal bleeding.

## 2024-04-06 NOTE — Progress Notes (Signed)
    Return Prenatal Note   Assessment/Plan   Plan  28 y.o. G2P1001 at [redacted]w[redacted]d presents for follow-up OB visit. Reviewed prenatal record including previous visit note.  Supervision of other normal pregnancy, antepartum - Reviewed recommendations for induction at 41 weeks, scheduled for 5/23 @ 0500. - Plan for NST with ROB next week.  - Membrane sweep today. 2-3/50/-3 - Reviewed labor warning signs and expectations for birth. Instructed to call office or come to hospital with persistent headache, vision changes, regular contractions, leaking of fluid, decreased fetal movement or vaginal bleeding.    No orders of the defined types were placed in this encounter.  Return in about 1 week (around 04/13/2024) for ROB with NST.   No future appointments.  For next visit:  continue with routine prenatal care     Subjective   28 y.o. G2P1001 at [redacted]w[redacted]d presents for this follow-up prenatal visit.  Patient is ready for baby! Hopes to go natural without an epidural.  Patient reports: Movement: Present Contractions: Irritability  Objective   Flow sheet Vitals: Pulse Rate: 79 BP: 118/80 Fundal Height: 39 cm Fetal Heart Rate (bpm): 140 Presentation: Vertex Dilation: 2.5 Effacement (%): 50 Station: -3 Total weight gain: 26 lb (11.8 kg)  General Appearance  No acute distress, well appearing, and well nourished Pulmonary   Normal work of breathing Neurologic   Alert and oriented to person, place, and time Psychiatric   Mood and affect within normal limits  Verita Glassman Leah Thornberry, CNM  04/06/2509:02 AM

## 2024-04-07 ENCOUNTER — Other Ambulatory Visit: Payer: Self-pay

## 2024-04-07 ENCOUNTER — Encounter: Payer: Self-pay | Admitting: Obstetrics

## 2024-04-07 ENCOUNTER — Observation Stay
Admission: EM | Admit: 2024-04-07 | Discharge: 2024-04-07 | Disposition: A | Discharge status: Home / Self Care | Attending: Family | Admitting: Family

## 2024-04-07 DIAGNOSIS — O99891 Other specified diseases and conditions complicating pregnancy: Secondary | ICD-10-CM

## 2024-04-07 DIAGNOSIS — Z3480 Encounter for supervision of other normal pregnancy, unspecified trimester: Principal | ICD-10-CM

## 2024-04-07 DIAGNOSIS — Z87891 Personal history of nicotine dependence: Secondary | ICD-10-CM | POA: Diagnosis not present

## 2024-04-07 DIAGNOSIS — Z91010 Allergy to peanuts: Secondary | ICD-10-CM | POA: Insufficient documentation

## 2024-04-07 DIAGNOSIS — R109 Unspecified abdominal pain: Secondary | ICD-10-CM | POA: Diagnosis not present

## 2024-04-07 DIAGNOSIS — O09299 Supervision of pregnancy with other poor reproductive or obstetric history, unspecified trimester: Secondary | ICD-10-CM

## 2024-04-07 DIAGNOSIS — O479 False labor, unspecified: Secondary | ICD-10-CM | POA: Diagnosis present

## 2024-04-07 DIAGNOSIS — O471 False labor at or after 37 completed weeks of gestation: Secondary | ICD-10-CM | POA: Diagnosis present

## 2024-04-07 DIAGNOSIS — Z3A39 39 weeks gestation of pregnancy: Secondary | ICD-10-CM | POA: Insufficient documentation

## 2024-04-07 NOTE — Final Progress Note (Signed)
 OB/Triage Note  Patient ID: Morgan Young MRN: [id number] DOB/AGE: [birth year] 28 y.o.  Subjective  History of Present Illness: The patient is a 28 y.o. female G2P1001 at [redacted]w[redacted]d who presents for labor evaluation. Reports contractions since having membranes swept yesterday. Was having more painful contractions last night, less intense now. Reports good FM. Denies VB, LOF, HA, visual changes or RUQ pain..   Past Medical History:  Diagnosis Date   Allergy    Anxiety    History of self-harm    History of self-harm    cutting-last 2011   History of urinary tract infection    Ovarian cyst 2024   Scoliosis    Screening for diabetes mellitus 09/29/2023    Past Surgical History:  Procedure Laterality Date   Denies surgical hx     LAPAROSCOPIC OVARIAN CYSTECTOMY Left 08/31/2023   Procedure: LAPAROSCOPIC OVARIAN CYSTECTOMY;  Surgeon: Prescilla Brod, MD;  Location: [facility] ORS;  Service: Gynecology;  Laterality: Left;   WISDOM TOOTH EXTRACTION     four; age 63    No current facility-administered medications on file prior to encounter.   Current Outpatient Medications on File Prior to Encounter  Medication Sig Dispense Refill   acetaminophen  (TYLENOL ) 325 MG tablet Take 650 mg by mouth every 6 (six) hours as needed.     EPINEPHrine  (EPIPEN  2-PAK) 0.3 mg/0.3 mL IJ SOAJ injection Inject 0.3 mg into the muscle as needed for anaphylaxis. 2 each 0   ferrous sulfate  325 (65 FE) MG tablet Take 1 tablet (325 mg total) by mouth every other day. 60 tablet 4   levocetirizine (XYZAL) 5 MG tablet Take 5 mg by mouth daily.     montelukast (SINGULAIR) 10 MG tablet Take 10 mg by mouth daily.     ondansetron  (ZOFRAN -ODT) 4 MG disintegrating tablet Take 4 mg by mouth every 8 (eight) hours as needed.     Prenatal Vit-Fe Fumarate-FA (MULTIVITAMIN-PRENATAL) 27-0.8 MG TABS tablet Take 1 tablet by mouth daily at 12 noon.     promethazine  (PHENERGAN ) 12.5 MG tablet Take 1 tablet (12.5 mg total) by mouth every 6  (six) hours as needed for nausea or vomiting. 60 tablet 2    Allergies  Allergen Reactions   Peanut-Containing Drug Products Hives   Cantaloupe (Diagnostic) Swelling    Rash on face, swelling in eyes   Watermelon [Citrullus Vulgaris] Hives    Social History   Socioeconomic History   Marital status: Single    Spouse name: Not on file   Number of children: 1   Years of education: 12   Highest education level: High school graduate  Occupational History   Occupation: Designer, industrial/product  Tobacco Use   Smoking status: Never   Smokeless tobacco: Never  Vaping Use   Vaping status: Former   Quit date: 08/04/2023  Substance and Sexual Activity   Alcohol use: Not Currently    Comment: Last ETOH use approx 1 year ago   Drug use: Not Currently    Types: Marijuana    Comment: Last use January or February 2020.   Sexual activity: Yes    Partners: Male    Birth control/protection: Surgical  Other Topics Concern   Not on file  Social History Narrative   Not on file   Social Drivers of Health   Financial Resource Strain: Low Risk  (08/20/2023)   Overall Financial Resource Strain (CARDIA)    Difficulty of Paying Living Expenses: Not hard at all  Food Insecurity: No Food  Insecurity (08/20/2023)   Hunger Vital Sign    Worried About Running Out of Food in the Last Year: Never true    Ran Out of Food in the Last Year: Never true  Transportation Needs: No Transportation Needs (08/20/2023)   PRAPARE - Administrator, Civil Service (Medical): No    Lack of Transportation (Non-Medical): No  Physical Activity: Sufficiently Active (08/20/2023)   Exercise Vital Sign    Days of Exercise per Week: 7 days    Minutes of Exercise per Session: 60 min  Recent Concern: Physical Activity - Inactive (05/25/2023)   Exercise Vital Sign    Days of Exercise per Week: 0 days    Minutes of Exercise per Session: 0 min  Stress: No Stress Concern Present (08/20/2023)   [employer] of  Occupational Health - Occupational Stress Questionnaire    Feeling of Stress : Not at all  Recent Concern: Stress - Stress Concern Present (05/25/2023)   [employer] of Occupational Health - Occupational Stress Questionnaire    Feeling of Stress : To some extent  Social Connections: Moderately Isolated (08/20/2023)   Social Connection and Isolation Panel [NHANES]    Frequency of Communication with Friends and Family: Three times a week    Frequency of Social Gatherings with Friends and Family: Once a week    Attends Religious Services: Never    Database administrator or Organizations: No    Attends Banker Meetings: Never    Marital Status: Living with partner  Intimate Partner Violence: Not At Risk (08/20/2023)   Humiliation, Afraid, Rape, and Kick questionnaire    Fear of Current or Ex-Partner: No    Emotionally Abused: No    Physically Abused: No    Sexually Abused: No    Family History  Problem Relation Age of Onset   Asthma Mother    Hypertension Mother    Diabetes Mellitus I Mother        Chaned to type 1 2 weeks ago   Hyperlipidemia Mother    Liver disease Mother    Depression Mother    Other Father        unknown medical history   Diabetes Sister    Healthy Sister    Liver disease Brother    Healthy Brother    Hypertension Maternal Grandmother    Hyperlipidemia Maternal Grandmother    HIV/AIDS Maternal Grandmother    Pancreatic cancer Maternal Grandfather    Heart murmur Daughter      ROS    Objective  Physical Exam: BP 132/72 (BP Location: Right Arm)   Pulse 85   Temp 98.4 F (36.9 C) (Oral)   Resp 15   Ht 5\' 6"  (1.676 m)   Wt 106.6 kg   LMP 07/06/2023 (Approximate)   BMI 37.93 kg/m   OBGyn Exam  SVE 2.5cm, unchanged 1.5 hours later, also same exam as when in clinic yesterday  FHT 135, mod variability, pos accels, no decels Toco Ucs q3-19min   Hospital Course: The patient was admitted to [facility] Triage for observation. Had no  cervical change, contractions every 3-72min. Labor precautions discussed.  Assessment: 28 y.o. female G2P1001 at [redacted]w[redacted]d  Not in labor RNST  Plan: Discharge home Follow up at next ROB, keep scheduled IOL date Teaching: labor precautions  Discharge Instructions     Discharge activity:  No Restrictions   Complete by: As directed    Discharge diet:  No restrictions   Complete by: As directed  Discharge instructions   Complete by: As directed    Follow up with your OB provider at your next ROB   LABOR:  When conractions begin, you should start to time them from the beginning of one contraction to the beginning  of the next.  When contractions are 5 - 10 minutes apart or less and have been regular for at least an hour, you should call your health care provider.   Complete by: As directed    No sexual activity restrictions   Complete by: As directed    Notify physician for bleeding from the vagina   Complete by: As directed    Notify physician for blurring of vision or spots before the eyes   Complete by: As directed    Notify physician for chills or fever   Complete by: As directed    Notify physician for fainting spells, "black outs" or loss of consciousness   Complete by: As directed    Notify physician for increase in vaginal discharge   Complete by: As directed    Notify physician for leaking of fluid   Complete by: As directed    Notify physician for pain or burning when urinating   Complete by: As directed    Notify physician for pelvic pressure (sudden increase)   Complete by: As directed    Notify physician for severe or continued nausea or vomiting   Complete by: As directed    Notify physician for sudden gushing of fluid from the vagina (with or without continued leaking)   Complete by: As directed    Notify physician for sudden, constant, or occasional abdominal pain   Complete by: As directed    Notify physician if baby moving less than usual   Complete by: As  directed       Allergies as of 04/07/2024       Reactions   Peanut-containing Drug Products Hives   Cantaloupe (diagnostic) Swelling   Rash on face, swelling in eyes   Watermelon [citrullus Vulgaris] Hives        Medication List     TAKE these medications    acetaminophen  325 MG tablet Commonly known as: TYLENOL  Take 650 mg by mouth every 6 (six) hours as needed.   EPINEPHrine  0.3 mg/0.3 mL Soaj injection Commonly known as: EpiPen  2-Pak Inject 0.3 mg into the muscle as needed for anaphylaxis.   ferrous sulfate  325 (65 FE) MG tablet Take 1 tablet (325 mg total) by mouth every other day.   levocetirizine 5 MG tablet Commonly known as: XYZAL Take 5 mg by mouth daily.   montelukast 10 MG tablet Commonly known as: SINGULAIR Take 10 mg by mouth daily.   multivitamin-prenatal 27-0.8 MG Tabs tablet Take 1 tablet by mouth daily at 12 noon.   ondansetron  4 MG disintegrating tablet Commonly known as: ZOFRAN -ODT Take 4 mg by mouth every 8 (eight) hours as needed.   promethazine  12.5 MG tablet Commonly known as: PHENERGAN  Take 1 tablet (12.5 mg total) by mouth every 6 (six) hours as needed for nausea or vomiting.         Total time spent taking care of this patient: 2 hours  Signed: Lou Rounds CNM, FNP 04/07/2024, 12:10 PM

## 2024-04-07 NOTE — OB Triage Note (Signed)
 Patient is a G2P1001 at [redacted]w[redacted]d who presents to unit c/o contractions since her membrane sweep yesterday afternoon. Reports losing her mucous plug and having some bloody show since then. Reports +fetal movement, denies leakage of fluid. External monitors applied and assessing. Initial FHT 155. Vital signs WDL.

## 2024-04-07 NOTE — Progress Notes (Signed)
   04/07/24 1030  Spiritual Encounters  Type of Visit Initial  Care provided to: Pt and family (Mother waiting in lobby as patient already has a Equities trader)  Psychologist, sport and exercise partners present during Programmer, systems  Referral source Nurse (RN/NT/LPN) (Chaplain helped mother get from the ED over to the medical mall)  Reason for visit Routine spiritual support  Spiritual Framework  Presenting Themes Meaning/purpose/sources of inspiration  Interventions  Spiritual Care Interventions Made Established relationship of care and support;Compassionate presence;Reflective listening  Intervention Outcomes  Outcomes Connection to spiritual care;Awareness around self/spiritual resourses;Awareness of support  Spiritual Care Plan  Spiritual Care Issues Still Outstanding No further spiritual care needs at this time (see row info)   Chaplain helped patient's mother get to medical mall and checked with patient to verify whether mother could come up or not. Patient wanted her significant other to stay with her for the time being. Chaplain explained to mother and mother was happy to wait in medical mall lobby

## 2024-04-08 NOTE — Progress Notes (Signed)
Discharge instructions provided to pt. Pt verbalizes understanding. Vaginal bleeding and discharge, contractions, and fetal movement reviewed by RN. Follow-up care reviewed. Pt discharged home with significant other.  

## 2024-04-14 ENCOUNTER — Encounter: Payer: Self-pay | Admitting: Advanced Practice Midwife

## 2024-04-14 ENCOUNTER — Other Ambulatory Visit

## 2024-04-14 ENCOUNTER — Ambulatory Visit (INDEPENDENT_AMBULATORY_CARE_PROVIDER_SITE_OTHER): Admitting: Advanced Practice Midwife

## 2024-04-14 VITALS — BP 131/83 | HR 77 | Wt 241.2 lb

## 2024-04-14 DIAGNOSIS — O480 Post-term pregnancy: Secondary | ICD-10-CM

## 2024-04-14 DIAGNOSIS — Z3A40 40 weeks gestation of pregnancy: Secondary | ICD-10-CM

## 2024-04-14 DIAGNOSIS — Z3480 Encounter for supervision of other normal pregnancy, unspecified trimester: Secondary | ICD-10-CM

## 2024-04-14 NOTE — Progress Notes (Signed)
 Routine Prenatal Care Visit  Subjective  Morgan Young is a 28 y.o. G2P1001 at [redacted]w[redacted]d being seen today for ongoing prenatal care.  She is currently monitored for the following issues for this low-risk pregnancy and has Obesity (BMI 30-39.9); Seasonal allergies; Nut allergy; Depression, major, single episode, mild (HCC); Supervision of other normal pregnancy, antepartum; IUGR (intrauterine growth restriction) in prior pregnancy, pregnant; Elevated hemoglobin A1c; Maternal varicella, non-immune; Anemia affecting pregnancy; and Uterine contractions on their problem list.  ----------------------------------------------------------------------------------- Patient reports she is doing well. Having painful contractions every few minutes lasting about 30 seconds since last night. Reviewed IOL methods.   Contractions: Irregular. Vag. Bleeding: None.  Movement: Present. Leaking Fluid denies.  ----------------------------------------------------------------------------------- The following portions of the patient's history were reviewed and updated as appropriate: allergies, current medications, past family history, past medical history, past social history, past surgical history and problem list. Problem list updated.  Objective  Blood pressure 131/83, pulse 77, weight 241 lb 3.2 oz (109.4 kg), last menstrual period 07/06/2023. Pregravid weight 210 lb (95.3 kg) Total Weight Gain 31 lb 3.2 oz (14.2 kg) Urinalysis: Urine Protein    Urine Glucose    Fetal Status: Fetal Heart Rate (bpm): 140   Movement: Present     Reactive NST: 140 bpm, moderate variability, +accelerations, -decelerations  General:  Alert, oriented and cooperative. Patient is in no acute distress.  Skin: Skin is warm and dry. No rash noted.   Cardiovascular: Normal heart rate noted  Respiratory: Normal respiratory effort, no problems with respiration noted  Abdomen: Soft, gravid, appropriate for gestational age. Pain/Pressure: Absent      Pelvic:  Cervical exam deferred        Extremities: Normal range of motion.  Edema: None  Mental Status: Normal mood and affect. Normal behavior. Normal judgment and thought content.   Assessment   28 y.o. G2P1001 at [redacted]w[redacted]d by  04/09/2024, by Ultrasound presenting for routine prenatal visit  Plan   second Problems (from 08/20/23 to present)     Problem Noted Diagnosed Resolved   IUGR (intrauterine growth restriction) in prior pregnancy, pregnant 09/25/2023 by Forestine Igo, CNM  No   Overview Signed 09/25/2023 12:31 PM by Forestine Igo, CNM  G1-IOL at 36w, 4#2oz      Supervision of other normal pregnancy, antepartum 08/20/2023 by Delta Fila, CMA  No   Overview Addendum 01/15/2024 10:19 AM by Higinio Love, CMA   Clinical Staff Provider  Office Location  [facility] Dating  08/14/23: 101w2d; low FHR;   Language  English Anatomy US     Flu Vaccine  declined Genetic Screen  NIPS: low risk, XX  TDaP vaccine  01/15/24 Hgb A1C or  GTT Early :5.8% / 1hGTT 96 Third trimester :   Covid declined   LAB RESULTS   Rhogam  A/Positive/-- (11/01 1108)  Blood Type A/Positive/-- (11/01 1108)   RSV  Antibody Negative (11/01 1108)  Feeding Plan breast Rubella 1.26 (11/01 1108)  Contraception BTL RPR Non Reactive (11/01 1108)   Circumcision yes HBsAg Negative (11/01 1108)   Pediatrician  Burl; Ped HIV Non Reactive (11/01 1108)  Support Person Terence Varicella Non Reactive (11/01 1108)  Prenatal Classes no GBS  (For PCN allergy, check sensitivities)     Hep C Non Reactive (11/01 1108) Non reactive 2021  BTL Consent  Pap Diagnosis  Date Value Ref Range Status  09/25/2023   Final   - Negative for intraepithelial lesion or malignancy (NILM)    VBAC Consent  Hgb Electro      CF      SMA   Free T4 1 2021 TSH 3.510 2021              Term labor symptoms and general obstetric precautions including but not limited to vaginal bleeding, contractions, leaking of fluid and fetal  movement were reviewed in detail with the patient. Please refer to After Visit Summary for other counseling recommendations.   Return for IOL tomorrow morning at 5 am.  Angelita Kendall, [facility] 04/14/2024 8:53 AM

## 2024-04-14 NOTE — Patient Instructions (Signed)

## 2024-04-15 ENCOUNTER — Inpatient Hospital Stay: Admitting: General Practice

## 2024-04-15 ENCOUNTER — Other Ambulatory Visit: Payer: Self-pay

## 2024-04-15 ENCOUNTER — Inpatient Hospital Stay
Admission: EM | Admit: 2024-04-15 | Discharge: 2024-04-17 | DRG: 806 | Disposition: A | Discharge status: Home / Self Care

## 2024-04-15 ENCOUNTER — Encounter: Payer: Self-pay | Admitting: Obstetrics and Gynecology

## 2024-04-15 DIAGNOSIS — Z79899 Other long term (current) drug therapy: Secondary | ICD-10-CM | POA: Diagnosis not present

## 2024-04-15 DIAGNOSIS — D62 Acute posthemorrhagic anemia: Secondary | ICD-10-CM | POA: Diagnosis not present

## 2024-04-15 DIAGNOSIS — O99214 Obesity complicating childbirth: Secondary | ICD-10-CM | POA: Diagnosis present

## 2024-04-15 DIAGNOSIS — Z833 Family history of diabetes mellitus: Secondary | ICD-10-CM

## 2024-04-15 DIAGNOSIS — Z8249 Family history of ischemic heart disease and other diseases of the circulatory system: Secondary | ICD-10-CM

## 2024-04-15 DIAGNOSIS — O9962 Diseases of the digestive system complicating childbirth: Secondary | ICD-10-CM | POA: Diagnosis present

## 2024-04-15 DIAGNOSIS — K219 Gastro-esophageal reflux disease without esophagitis: Secondary | ICD-10-CM | POA: Diagnosis present

## 2024-04-15 DIAGNOSIS — O9081 Anemia of the puerperium: Secondary | ICD-10-CM | POA: Diagnosis not present

## 2024-04-15 DIAGNOSIS — O9902 Anemia complicating childbirth: Secondary | ICD-10-CM | POA: Diagnosis not present

## 2024-04-15 DIAGNOSIS — Z2839 Other underimmunization status: Secondary | ICD-10-CM

## 2024-04-15 DIAGNOSIS — O480 Post-term pregnancy: Secondary | ICD-10-CM | POA: Diagnosis present

## 2024-04-15 DIAGNOSIS — O99344 Other mental disorders complicating childbirth: Secondary | ICD-10-CM | POA: Diagnosis not present

## 2024-04-15 DIAGNOSIS — Z3480 Encounter for supervision of other normal pregnancy, unspecified trimester: Secondary | ICD-10-CM

## 2024-04-15 DIAGNOSIS — D649 Anemia, unspecified: Secondary | ICD-10-CM

## 2024-04-15 DIAGNOSIS — F32A Depression, unspecified: Secondary | ICD-10-CM | POA: Diagnosis not present

## 2024-04-15 DIAGNOSIS — Z3A40 40 weeks gestation of pregnancy: Secondary | ICD-10-CM | POA: Diagnosis not present

## 2024-04-15 DIAGNOSIS — Z3490 Encounter for supervision of normal pregnancy, unspecified, unspecified trimester: Principal | ICD-10-CM | POA: Diagnosis present

## 2024-04-15 DIAGNOSIS — F320 Major depressive disorder, single episode, mild: Secondary | ICD-10-CM | POA: Diagnosis present

## 2024-04-15 DIAGNOSIS — O99019 Anemia complicating pregnancy, unspecified trimester: Secondary | ICD-10-CM | POA: Diagnosis present

## 2024-04-15 DIAGNOSIS — E669 Obesity, unspecified: Secondary | ICD-10-CM | POA: Diagnosis present

## 2024-04-15 LAB — CBC
HCT: 29.9 % — ABNORMAL LOW (ref 36.0–46.0)
Hemoglobin: 10 g/dL — ABNORMAL LOW (ref 12.0–15.0)
MCH: 26.8 pg (ref 26.0–34.0)
MCHC: 33.4 g/dL (ref 30.0–36.0)
MCV: 80.2 fL (ref 80.0–100.0)
Platelets: 234 10*3/uL (ref 150–400)
RBC: 3.73 MIL/uL — ABNORMAL LOW (ref 3.87–5.11)
RDW: 15.1 % (ref 11.5–15.5)
WBC: 7.2 10*3/uL (ref 4.0–10.5)
nRBC: 0 % (ref 0.0–0.2)

## 2024-04-15 LAB — TYPE AND SCREEN
ABO/RH(D): A POS
Antibody Screen: NEGATIVE

## 2024-04-15 LAB — RPR: RPR Ser Ql: NONREACTIVE

## 2024-04-15 MED ORDER — METHYLERGONOVINE MALEATE 0.2 MG/ML IJ SOLN
0.2000 mg | Freq: Once | INTRAMUSCULAR | Status: AC
  Administered 2024-04-15: 0.2 mg via INTRAMUSCULAR

## 2024-04-15 MED ORDER — PHENYLEPHRINE 80 MCG/ML (10ML) SYRINGE FOR IV PUSH (FOR BLOOD PRESSURE SUPPORT): 80.0000 ug | PREFILLED_SYRINGE | INTRAVENOUS | Status: DC | PRN

## 2024-04-15 MED ORDER — METHYLERGONOVINE MALEATE 0.2 MG/ML IJ SOLN
INTRAMUSCULAR | Status: AC
  Filled 2024-04-15: qty 1

## 2024-04-15 MED ORDER — FENTANYL-BUPIVACAINE-NACL 0.5-0.125-0.9 MG/250ML-% EP SOLN
12.0000 mL/h | EPIDURAL | Status: DC | PRN
  Administered 2024-04-15: 12 mL/h via EPIDURAL

## 2024-04-15 MED ORDER — BUPIVACAINE HCL (PF) 0.25 % IJ SOLN
INTRAMUSCULAR | Status: DC | PRN
  Administered 2024-04-15 (×2): 4 mL via EPIDURAL

## 2024-04-15 MED ORDER — DIPHENHYDRAMINE HCL 50 MG/ML IJ SOLN: 12.5000 mg | INTRAMUSCULAR | Status: DC | PRN

## 2024-04-15 MED ORDER — FENTANYL-BUPIVACAINE-NACL 0.5-0.125-0.9 MG/250ML-% EP SOLN
EPIDURAL | Status: AC
Start: 2024-04-15 — End: 2024-04-15
  Filled 2024-04-15: qty 250

## 2024-04-15 MED ORDER — OXYTOCIN 10 UNIT/ML IJ SOLN: 10.0000 [IU] | Freq: Once | INTRAMUSCULAR | Status: DC

## 2024-04-15 MED ORDER — OXYTOCIN-SODIUM CHLORIDE 30-0.9 UT/500ML-% IV SOLN
1.0000 m[IU]/min | INTRAVENOUS | Status: DC
Start: 2024-04-15 — End: 2024-04-16
  Administered 2024-04-15: 2 m[IU]/min via INTRAVENOUS
  Filled 2024-04-15: qty 500

## 2024-04-15 MED ORDER — OXYCODONE-ACETAMINOPHEN 5-325 MG PO TABS: 1.0000 | ORAL_TABLET | ORAL | Status: DC | PRN

## 2024-04-15 MED ORDER — ONDANSETRON HCL 4 MG/2ML IJ SOLN
4.0000 mg | Freq: Four times a day (QID) | INTRAMUSCULAR | Status: DC | PRN
  Administered 2024-04-15: 4 mg via INTRAVENOUS
  Filled 2024-04-15: qty 2

## 2024-04-15 MED ORDER — SOD CITRATE-CITRIC ACID 500-334 MG/5ML PO SOLN: 30.0000 mL | ORAL | Status: DC | PRN

## 2024-04-15 MED ORDER — LACTATED RINGERS IV SOLN
500.0000 mL | Freq: Once | INTRAVENOUS | Status: AC
  Administered 2024-04-15: 500 mL via INTRAVENOUS

## 2024-04-15 MED ORDER — EPHEDRINE 5 MG/ML INJ: 10.0000 mg | INTRAVENOUS | Status: DC | PRN

## 2024-04-15 MED ORDER — MISOPROSTOL 200 MCG PO TABS
ORAL_TABLET | ORAL | Status: AC
  Filled 2024-04-15: qty 4

## 2024-04-15 MED ORDER — LACTATED RINGERS IV SOLN: 500.0000 mL | INTRAVENOUS | Status: DC | PRN

## 2024-04-15 MED ORDER — OXYTOCIN 10 UNIT/ML IJ SOLN
INTRAMUSCULAR | Status: AC
  Filled 2024-04-15: qty 2

## 2024-04-15 MED ORDER — OXYCODONE-ACETAMINOPHEN 5-325 MG PO TABS
2.0000 | ORAL_TABLET | ORAL | Status: DC | PRN
  Administered 2024-04-16: 2 via ORAL
  Filled 2024-04-15: qty 2

## 2024-04-15 MED ORDER — LIDOCAINE-EPINEPHRINE (PF) 1.5 %-1:200000 IJ SOLN
INTRAMUSCULAR | Status: DC | PRN
  Administered 2024-04-15: 3 mL via PERINEURAL

## 2024-04-15 MED ORDER — LACTATED RINGERS IV SOLN: INTRAVENOUS | Status: DC

## 2024-04-15 MED ORDER — LIDOCAINE HCL (PF) 1 % IJ SOLN
30.0000 mL | INTRAMUSCULAR | Status: DC | PRN
  Filled 2024-04-15: qty 30

## 2024-04-15 MED ORDER — OXYTOCIN BOLUS FROM INFUSION
333.0000 mL | Freq: Once | INTRAVENOUS | Status: AC
  Administered 2024-04-15: 333 mL via INTRAVENOUS

## 2024-04-15 MED ORDER — TRANEXAMIC ACID-NACL 1000-0.7 MG/100ML-% IV SOLN
INTRAVENOUS | Status: AC
  Filled 2024-04-15: qty 100

## 2024-04-15 MED ORDER — ACETAMINOPHEN 325 MG PO TABS: 650.0000 mg | ORAL_TABLET | ORAL | Status: DC | PRN

## 2024-04-15 MED ORDER — LIDOCAINE HCL (PF) 1 % IJ SOLN
INTRAMUSCULAR | Status: DC | PRN
  Administered 2024-04-15 (×2): 3 mL

## 2024-04-15 MED ORDER — OXYTOCIN-SODIUM CHLORIDE 30-0.9 UT/500ML-% IV SOLN
2.5000 [IU]/h | INTRAVENOUS | Status: DC
  Administered 2024-04-15: 2.5 [IU]/h via INTRAVENOUS

## 2024-04-15 MED ORDER — AMMONIA AROMATIC IN INHA
RESPIRATORY_TRACT | Status: AC
  Filled 2024-04-15: qty 10

## 2024-04-15 NOTE — Discharge Summary (Signed)
 Postpartum Discharge Summary  Date of Service updated***     Patient Name: Morgan Young DOB: 16-Jul-1996 MRN: [id number]  Date of admission: 04/15/2024 Delivery date:04/15/2024 Delivering provider: Zenobia Hila  Date of discharge: 04/15/2024  Admitting diagnosis: Encounter for induction of labor [Z34.90] Intrauterine pregnancy: [redacted]w[redacted]d     Secondary diagnosis:  Principal Problem:   Encounter for induction of labor Active Problems:   Obesity (BMI 30-39.9)   Depression, major, single episode, mild (HCC)   Supervision of other normal pregnancy, antepartum   Maternal varicella, non-immune   Anemia affecting pregnancy  Additional problems: Prolonged second stage    Discharge diagnosis: Term Pregnancy Delivered                                              Post partum procedures:{Postpartum procedures:23558} Augmentation: AROM, Pitocin, and IP Foley Complications: Prolonged second stage  Hospital course: Induction of Labor With Vaginal Delivery   28 y.o. yo G2P1001 at [redacted]w[redacted]d was admitted to the hospital 04/15/2024 for induction of labor.  Indication for induction: Postdates.  Patient had an labor course complicated by prolonged second stage and maternal exhaustion. Membrane Rupture Time/Date: 3:57 PM,04/15/2024  Delivery Method:Vaginal, Vacuum (Extractor) Operative Delivery:Device used:Vacuum Indication: Maternal exhaustion and Prolonged second stage Episiotomy: None Lacerations:    Details of delivery can be found in separate delivery note.  Patient had a postpartum course complicated by***. Patient is discharged home 04/15/24.  Newborn Data: Birth date:04/15/2024 Birth time:11:03 PM Gender:Female Living status:Living Apgars:8 ,9  Weight:3740 g  Magnesium Sulfate received: No BMZ received: No Rhophylac:N/A MMR:N/A T-DaP:Given prenatally Flu: No RSV Vaccine received: Yes Transfusion:{Transfusion received:30440034} Immunizations administered: Immunization History   Administered Date(s) Administered   HPV Quadrivalent 06/07/2007, 05/03/2014, 01/19/2015   Tdap 02/22/2020, 01/15/2024    Physical exam  Vitals:   04/15/24 1830 04/15/24 1835 04/15/24 1843 04/15/24 1845  BP:      Pulse:      Resp:      Temp:      TempSrc:      SpO2: 98% 99% 99% 100%  Weight:      Height:       General: {Exam; general:21111117} Lochia: {Desc; appropriate/inappropriate:30686::"appropriate"} Uterine Fundus: {Desc; firm/soft:30687} Incision: {Exam; incision:21111123} DVT Evaluation: {Exam; dvt:2111122} Labs: Lab Results  Component Value Date   WBC 7.2 04/15/2024   HGB 10.0 (L) 04/15/2024   HCT 29.9 (L) 04/15/2024   MCV 80.2 04/15/2024   PLT 234 04/15/2024      Latest Ref Rng & Units 08/31/2023    5:19 AM  CMP  Glucose 70 - 99 mg/dL 811   BUN 6 - 20 mg/dL 11   Creatinine 9.14 - 1.00 mg/dL 7.82   Sodium 956 - 213 mmol/L 132   Potassium 3.5 - 5.1 mmol/L 3.6   Chloride 98 - 111 mmol/L 106   CO2 22 - 32 mmol/L 19   Calcium 8.9 - 10.3 mg/dL 8.3   Total Protein 6.5 - 8.1 g/dL 7.1   Total Bilirubin 0.3 - 1.2 mg/dL 0.6   Alkaline Phos 38 - 126 U/L 45   AST 15 - 41 U/L 12   ALT 0 - 44 U/L 10    Edinburgh Score:    08/20/2023   11:40 AM  Edinburgh Postnatal Depression Scale Screening Tool  I have been able to laugh and see the funny  side of things. 0  I have looked forward with enjoyment to things. 0  I have blamed myself unnecessarily when things went wrong. 0  I have been anxious or worried for no good reason. 0  I have felt scared or panicky for no good reason. 0  Things have been getting on top of me. 0  I have been so unhappy that I have had difficulty sleeping. 0  I have felt sad or miserable. 0  I have been so unhappy that I have been crying. 0  The thought of harming myself has occurred to me. 0  Edinburgh Postnatal Depression Scale Total 0      After visit meds:  Allergies as of 04/15/2024       Reactions   Peanut-containing Drug  Products Hives   Cantaloupe (diagnostic) Swelling   Rash on face, swelling in eyes   Watermelon [citrullus Vulgaris] Hives     Med Rec must be completed prior to using this [facility]***        Discharge home in stable condition Infant Feeding: {Baby feeding:23562} Infant Disposition:{CHL IP OB HOME WITH ZOXWRU:04540} Discharge instruction: per After Visit Summary and Postpartum booklet. Activity: Advance as tolerated. Pelvic rest for 6 weeks.  Diet: {OB diet:21111121} Anticipated Birth Control: {Birth Control:23956} Postpartum Appointment:{Outpatient follow up:23559} Additional Postpartum F/U: {PP Procedure:23957} Future Appointments:No future appointments. Follow up Visit:      04/15/2024 Phylliss Brenner, CNM

## 2024-04-15 NOTE — Progress Notes (Signed)
 Labor Progress Note   ASSESSMENT/PLAN   Morgan Young 28 y.o.   G2P1001  at [redacted]w[redacted]d here for postdates IOL.  FWB:  - Fetal well being assessed: Category 2        GBS: - GBS negative  LABOR: -  Second stage, slow descent. She has moved the baby from 0 to +1 station. Caput noted. - Continue to titrate Pitocin and assist with position changes - Pain Management: Epidural - Anticipate SVD   Labor Progress Cervical Exam  Last 3 exams Dil Eff Sta Exam Time  10 -- -- 1843  9.5 -- 0;Plus 1 1839  9.5 80 -1 1743      SUBJECTIVE/OBJECTIVE   SUBJECTIVE:  Morgan Young has been pushing for approximately an hour with slow descent. She is tired and having some difficulty sustaining pushing efforts.   OBJECTIVE: Vital Signs: Patient Vitals for the past 12 hrs:  BP Temp Temp src Pulse Resp SpO2  04/15/24 1845 -- -- -- -- -- 100 %  04/15/24 1843 -- -- -- -- -- 99 %  04/15/24 1835 -- -- -- -- -- 99 %  04/15/24 1830 -- -- -- -- -- 98 %  04/15/24 1825 -- -- -- -- -- 99 %  04/15/24 1821 -- -- -- -- -- 100 %  04/15/24 1820 -- -- -- -- -- 100 %  04/15/24 1815 132/80 -- -- -- -- 98 %  04/15/24 1810 -- 98.2 F (36.8 C) Oral -- -- 98 %  04/15/24 1805 -- -- -- -- -- 97 %  04/15/24 1800 -- -- -- -- -- 98 %  04/15/24 1755 -- -- -- -- -- 99 %  04/15/24 1750 -- -- -- -- -- 100 %  04/15/24 1745 -- -- -- -- -- 98 %  04/15/24 1740 -- -- -- -- -- 99 %  04/15/24 1735 -- -- -- -- -- 99 %  04/15/24 1730 -- -- -- -- -- 100 %  04/15/24 1725 -- -- -- -- -- 99 %  04/15/24 1720 -- -- -- -- -- 99 %  04/15/24 1715 -- -- -- -- -- 99 %  04/15/24 1713 122/77 -- -- 80 -- --  04/15/24 1710 -- -- -- -- -- 100 %  04/15/24 1705 -- -- -- -- -- 99 %  04/15/24 1700 -- -- -- -- -- 99 %  04/15/24 1655 -- -- -- -- -- 100 %  04/15/24 1650 -- -- -- -- -- 99 %  04/15/24 1645 -- -- -- -- -- 100 %  04/15/24 1640 -- -- -- -- -- 100 %  04/15/24 1635 -- -- -- -- -- 97 %  04/15/24 1630 -- -- -- -- -- 100 %  04/15/24 1625 -- --  -- -- -- 100 %  04/15/24 1621 -- -- -- -- -- 99 %  04/15/24 1620 -- -- -- -- -- 99 %  04/15/24 1615 -- 98.1 F (36.7 C) Oral -- -- 100 %  04/15/24 1610 -- -- -- -- -- 100 %  04/15/24 1605 -- -- -- -- -- 100 %  04/15/24 1558 130/69 -- -- 73 -- 100 %  04/15/24 1551 106/87 -- -- 90 -- --  04/15/24 1550 -- -- -- -- -- 100 %  04/15/24 1545 -- -- -- -- -- 100 %  04/15/24 1540 -- -- -- -- -- 96 %  04/15/24 1535 -- -- -- -- -- 99 %  04/15/24 1530 -- -- -- -- -- 99 %  04/15/24  1520 -- -- -- -- -- 97 %  04/15/24 1518 -- 97.8 F (36.6 C) Oral -- -- 99 %  04/15/24 1510 -- -- -- -- -- 99 %  04/15/24 1505 -- -- -- -- -- 99 %  04/15/24 1500 102/60 -- -- 83 -- 99 %  04/15/24 1455 -- -- -- -- -- 99 %  04/15/24 1450 -- -- -- -- -- 99 %  04/15/24 1445 -- -- -- -- -- 99 %  04/15/24 1440 -- -- -- -- -- 100 %  04/15/24 1435 -- -- -- -- -- 100 %  04/15/24 1430 -- -- -- -- -- 99 %  04/15/24 1426 -- -- -- -- -- 100 %  04/15/24 1421 -- -- -- -- -- 100 %  04/15/24 1420 -- -- -- -- -- 100 %  04/15/24 1415 -- -- -- -- -- 99 %  04/15/24 1410 -- -- -- -- -- 99 %  04/15/24 1405 -- -- -- -- -- 99 %  04/15/24 1404 108/60 -- -- 74 -- --  04/15/24 1400 -- -- -- -- -- 100 %  04/15/24 1330 -- -- -- -- -- 100 %  04/15/24 1320 -- -- -- -- -- 100 %  04/15/24 1315 -- -- -- -- -- 99 %  04/15/24 1310 -- -- -- -- -- 100 %  04/15/24 1305 -- -- -- -- -- 100 %  04/15/24 1301 105/67 -- -- 70 -- --  04/15/24 1300 -- -- -- -- -- 100 %  04/15/24 1259 (!) 84/24 -- -- (!) 198 -- --  04/15/24 1255 -- -- -- -- -- 100 %  04/15/24 1250 -- -- -- -- -- 99 %  04/15/24 1245 -- -- -- -- -- 99 %  04/15/24 1243 124/73 -- -- 60 -- --  04/15/24 1240 -- -- -- -- -- 99 %  04/15/24 1235 -- -- -- -- -- 100 %  04/15/24 1230 -- -- -- -- -- 100 %  04/15/24 1228 127/76 -- -- 61 -- --  04/15/24 1225 -- -- -- -- -- 99 %  04/15/24 1220 -- -- -- -- -- 99 %  04/15/24 1215 -- -- -- -- -- 100 %  04/15/24 1213 126/73 -- -- 69 -- --  04/15/24  1210 -- -- -- -- -- 99 %  04/15/24 1205 -- -- -- -- -- 97 %  04/15/24 1200 -- -- -- -- -- 99 %  04/15/24 1158 126/65 -- -- 70 -- --  04/15/24 1155 -- -- -- -- -- 100 %  04/15/24 1153 130/73 -- -- 74 -- --  04/15/24 1150 -- -- -- -- -- 100 %  04/15/24 1148 125/74 -- -- 73 -- --  04/15/24 1145 124/72 -- -- 81 -- 100 %  04/15/24 1143 124/65 -- -- 74 -- --  04/15/24 1141 123/62 98.2 F (36.8 C) Oral 73 16 --  04/15/24 1140 -- -- -- -- -- 99 %  04/15/24 1138 (!) 143/81 -- -- (!) 125 -- --  04/15/24 1136 131/68 -- -- 76 -- --  04/15/24 1135 126/80 -- -- 82 -- 99 %  04/15/24 1130 -- -- -- -- -- 98 %  04/15/24 1125 -- -- -- -- -- 97 %  04/15/24 1123 121/74 -- -- 82 -- --  04/15/24 1120 -- -- -- -- -- 97 %    Last SVE:  Dilation: 10 Dilation Complete Date: 04/15/24 Dilation Complete Time: 1843 Effacement (%): 80 Cervical Position: Middle Station: 0, Plus  1 Presentation: Vertex Exam by:: Cinda Craze CNM -  , Rupture Date: 04/15/24, Rupture Time: 1557,    FHR:   - Baseline: 140 - Variability:  min/mod - Accels: present - Decels: variable Category 2  UTERINE ACTIVITY:  Contractions: q2-3 minutes  Phylliss Brenner, CNM

## 2024-04-15 NOTE — Anesthesia Procedure Notes (Signed)
 Epidural Patient location during procedure: OB Start time: 04/15/2024 11:18 AM End time: 04/15/2024 11:45 AM  Staffing Anesthesiologist: Enrique Harvest, MD Resident/CRNA: Orin Birk, CRNA Performed: resident/CRNA   Preanesthetic Checklist Completed: patient identified, IV checked, site marked, risks and benefits discussed, surgical consent, monitors and equipment checked, pre-op evaluation and timeout performed  Epidural Patient position: sitting Prep: ChloraPrep Patient monitoring: heart rate, continuous pulse ox and blood pressure Approach: midline Location: L3-L4 Injection technique: LOR saline  Needle:  Needle type: Tuohy  Needle gauge: 17 G Needle length: 9 cm and 9 Needle insertion depth: 7.5 cm Catheter type: closed end flexible Catheter size: 19 Gauge Catheter at skin depth: 12 cm Test dose: negative and 1.5% lidocaine  with Epi 1:200 K  Assessment Sensory level: T10 Events: blood not aspirated, no cerebrospinal fluid, injection not painful, no injection resistance, no paresthesia and negative IV test  Additional Notes 2 attempt Pt. Evaluated and documentation done after procedure finished. Patient identified. Risks/Benefits/Options discussed with patient including but not limited to bleeding, infection, nerve damage, paralysis, failed block, incomplete pain control, headache, blood pressure changes, nausea, vomiting, reactions to medication both or allergic, itching and postpartum back pain. Confirmed with bedside nurse the patient's most recent platelet count. Confirmed with patient that they are not currently taking any anticoagulation, have any bleeding history or any family history of bleeding disorders. Patient expressed understanding and wished to proceed. All questions were answered. Sterile technique was used throughout the entire procedure. Please see nursing notes for vital signs. Test dose was given through epidural catheter and negative prior to  continuing to dose epidural or start infusion. Warning signs of high block given to the patient including shortness of breath, tingling/numbness in hands, complete motor block, or any concerning symptoms with instructions to call for help. Patient was given instructions on fall risk and not to get out of bed. All questions and concerns addressed with instructions to call with any issues or inadequate analgesia.    Patient tolerated the insertion well without immediate complications.Reason for block:procedure for pain

## 2024-04-15 NOTE — Progress Notes (Signed)
 Labor Progress Note   ASSESSMENT/PLAN   JEZABEL LECKER 28 y.o.   G2P1001  at [redacted]w[redacted]d here for PDIOL.  FWB:  - Fetal well being assessed: Category 1        GBS: - GBS negative  LABOR: -  Active labor, doing well. - Pain Management: Epidural - Anticipate SVD   Labor Progress Cervical Exam  Last 2 exams Dil Eff Sta Exam Time  7.5 80 -1 1307  3 70 -2 0721     SUBJECTIVE/OBJECTIVE   SUBJECTIVE:  Viola is comfortable with her epidural. She is hoping to get a nap in before the baby is born.   OBJECTIVE: Vital Signs: Patient Vitals for the past 12 hrs:  BP Temp Temp src Pulse Resp SpO2 Height Weight  04/15/24 1330 -- -- -- -- -- 100 % -- --  04/15/24 1320 -- -- -- -- -- 100 % -- --  04/15/24 1315 -- -- -- -- -- 99 % -- --  04/15/24 1310 -- -- -- -- -- 100 % -- --  04/15/24 1305 -- -- -- -- -- 100 % -- --  04/15/24 1301 105/67 -- -- 70 -- -- -- --  04/15/24 1300 -- -- -- -- -- 100 % -- --  04/15/24 1259 (!) 84/24 -- -- (!) 198 -- -- -- --  04/15/24 1255 -- -- -- -- -- 100 % -- --  04/15/24 1250 -- -- -- -- -- 99 % -- --  04/15/24 1245 -- -- -- -- -- 99 % -- --  04/15/24 1243 124/73 -- -- 60 -- -- -- --  04/15/24 1240 -- -- -- -- -- 99 % -- --  04/15/24 1235 -- -- -- -- -- 100 % -- --  04/15/24 1230 -- -- -- -- -- 100 % -- --  04/15/24 1228 127/76 -- -- 61 -- -- -- --  04/15/24 1225 -- -- -- -- -- 99 % -- --  04/15/24 1220 -- -- -- -- -- 99 % -- --  04/15/24 1215 -- -- -- -- -- 100 % -- --  04/15/24 1213 126/73 -- -- 69 -- -- -- --  04/15/24 1210 -- -- -- -- -- 99 % -- --  04/15/24 1205 -- -- -- -- -- 97 % -- --  04/15/24 1200 -- -- -- -- -- 99 % -- --  04/15/24 1158 126/65 -- -- 70 -- -- -- --  04/15/24 1155 -- -- -- -- -- 100 % -- --  04/15/24 1153 130/73 -- -- 74 -- -- -- --  04/15/24 1150 -- -- -- -- -- 100 % -- --  04/15/24 1148 125/74 -- -- 73 -- -- -- --  04/15/24 1145 124/72 -- -- 81 -- 100 % -- --  04/15/24 1143 124/65 -- -- 74 -- -- -- --  04/15/24 1141  123/62 98.2 F (36.8 C) Oral 73 16 -- -- --  04/15/24 1140 -- -- -- -- -- 99 % -- --  04/15/24 1138 (!) 143/81 -- -- (!) 125 -- -- -- --  04/15/24 1136 131/68 -- -- 76 -- -- -- --  04/15/24 1135 126/80 -- -- 82 -- 99 % -- --  04/15/24 1130 -- -- -- -- -- 98 % -- --  04/15/24 1125 -- -- -- -- -- 97 % -- --  04/15/24 1123 121/74 -- -- 82 -- -- -- --  04/15/24 1120 -- -- -- -- -- 97 % -- --  04/15/24 0729 118/70  98 F (36.7 C) Oral 71 18 -- -- --  04/15/24 0637 126/75 98.2 F (36.8 C) Axillary 75 16 -- 5\' 6"  (1.676 m) 106.6 kg    Last SVE:  Dilation: 7.5 Effacement (%): 80 Cervical Position: Middle Station: -1 Presentation: Vertex Exam by:: A Franks RN  FHR:   - Baseline: 130 - Variability: moderate - Accels: present - Decels: none Category 1  UTERINE ACTIVITY:  Contractions: q2-3 min  Phylliss Brenner, CNM

## 2024-04-15 NOTE — Progress Notes (Signed)
 Labor Progress Note   ASSESSMENT/PLAN   Morgan Young 28 y.o.   G2P1001  at [redacted]w[redacted]d here for postdates IOL.  FWB:  - Fetal well being assessed: Category 1        GBS: - GBS negative  LABOR: - Cervical ripening, doing well. - Cook catheter in place. Feeling painful ctx - Continue low-dose Pitocin until catheter expelled - Pain Management: Labor support without medications - Anticipate SVD   SUBJECTIVE/OBJECTIVE   SUBJECTIVE:  Morgan Young is feeling painful contractions. Her mother is at the bedside and supportive.   OBJECTIVE: Vital Signs: Patient Vitals for the past 12 hrs:  BP Temp Temp src Pulse Resp Height Weight  04/15/24 0729 118/70 98 F (36.7 C) Oral 71 18 -- --  04/15/24 0637 126/75 98.2 F (36.8 C) Axillary 75 16 5\' 6"  (1.676 m) 106.6 kg    Last SVE:  Dilation: 3 Effacement (%): 70 Station: -2 Presentation: Vertex Exam by:: S Free CNM  FHR:   - Baseline: 135 - Variability: moderate - Accels: present - Decels: none Category 1  UTERINE ACTIVITY:  Contractions: q3-4 minutes  Phylliss Brenner, CNM

## 2024-04-15 NOTE — Progress Notes (Signed)
 Labor Progress Note   ASSESSMENT/PLAN   Morgan Young 28 y.o.   G2P1001  at [redacted]w[redacted]d here for PDIOL.  FWB:  - Fetal well being assessed: Category 1        GBS: - GBS negative  LABOR: -  Active labor, doing well. - Continue Pitocin augmentation - Pain Management: Epidural - Anticipate SVD   Labor Progress Cervical Exam  Last 3 exams Dil Eff Sta Exam Time  9 80 -1 1545  7.5 80 -1 1307  3 70 -2 0721      SUBJECTIVE/OBJECTIVE   SUBJECTIVE:  Morgan Young is feeling nauseated. She is starting to feel some pain with her contractions again.  OBJECTIVE: Vital Signs: Patient Vitals for the past 12 hrs:  BP Temp Temp src Pulse Resp SpO2 Height Weight  04/15/24 1518 -- 97.8 F (36.6 C) Oral -- -- 99 % -- --  04/15/24 1430 -- -- -- -- -- 99 % -- --  04/15/24 1426 -- -- -- -- -- 100 % -- --  04/15/24 1415 -- -- -- -- -- 99 % -- --  04/15/24 1400 -- -- -- -- -- 100 % -- --  04/15/24 1330 -- -- -- -- -- 100 % -- --  04/15/24 1320 -- -- -- -- -- 100 % -- --  04/15/24 1315 -- -- -- -- -- 99 % -- --  04/15/24 1310 -- -- -- -- -- 100 % -- --  04/15/24 1305 -- -- -- -- -- 100 % -- --  04/15/24 1301 105/67 -- -- 70 -- -- -- --  04/15/24 1300 -- -- -- -- -- 100 % -- --  04/15/24 1259 (!) 84/24 -- -- (!) 198 -- -- -- --  04/15/24 1255 -- -- -- -- -- 100 % -- --  04/15/24 1250 -- -- -- -- -- 99 % -- --  04/15/24 1245 -- -- -- -- -- 99 % -- --  04/15/24 1243 124/73 -- -- 60 -- -- -- --  04/15/24 1240 -- -- -- -- -- 99 % -- --  04/15/24 1235 -- -- -- -- -- 100 % -- --  04/15/24 1230 -- -- -- -- -- 100 % -- --  04/15/24 1228 127/76 -- -- 61 -- -- -- --  04/15/24 1225 -- -- -- -- -- 99 % -- --  04/15/24 1220 -- -- -- -- -- 99 % -- --  04/15/24 1215 -- -- -- -- -- 100 % -- --  04/15/24 1213 126/73 -- -- 69 -- -- -- --  04/15/24 1210 -- -- -- -- -- 99 % -- --  04/15/24 1205 -- -- -- -- -- 97 % -- --  04/15/24 1200 -- -- -- -- -- 99 % -- --  04/15/24 1158 126/65 -- -- 70 -- -- -- --   04/15/24 1155 -- -- -- -- -- 100 % -- --  04/15/24 1153 130/73 -- -- 74 -- -- -- --  04/15/24 1150 -- -- -- -- -- 100 % -- --  04/15/24 1148 125/74 -- -- 73 -- -- -- --  04/15/24 1145 124/72 -- -- 81 -- 100 % -- --  04/15/24 1143 124/65 -- -- 74 -- -- -- --  04/15/24 1141 123/62 98.2 F (36.8 C) Oral 73 16 -- -- --  04/15/24 1140 -- -- -- -- -- 99 % -- --  04/15/24 1138 (!) 143/81 -- -- (!) 125 -- -- -- --  04/15/24 1136 131/68 -- --  76 -- -- -- --  04/15/24 1135 126/80 -- -- 82 -- 99 % -- --  04/15/24 1130 -- -- -- -- -- 98 % -- --  04/15/24 1125 -- -- -- -- -- 97 % -- --  04/15/24 1123 121/74 -- -- 82 -- -- -- --  04/15/24 1120 -- -- -- -- -- 97 % -- --  04/15/24 0729 118/70 98 F (36.7 C) Oral 71 18 -- -- --  04/15/24 0637 126/75 98.2 F (36.8 C) Axillary 75 16 -- 5\' 6"  (1.676 m) 106.6 kg    Last SVE:  Dilation: 9 Effacement (%): 80 Cervical Position: Middle Station: -1 Presentation: Vertex Exam by:: M Deshanna Kama CNM -  ,  ,  ,    FHR:   - Baseline: 125 - Variability: moderate - Accels: present - Decels: none Category 1  UTERINE ACTIVITY:  Contractions: q2-3 minutes  Phylliss Brenner, CNM

## 2024-04-15 NOTE — Progress Notes (Signed)
 VACUUM DELIVERY NOTE PREOPERATIVE DIAGNOSES: 1. Term pregnancy [redacted]w[redacted]d pregnancy.  2. Maternal Exhaustion  3. Prolonged second stage   POSTOPERATIVE DIAGNOSES: Same with: 1)  [redacted]w[redacted]d pregnancy s/p Vaginal, Vacuum (Extractor) 2)  female infant, Apgar scores of    at 1 minute and    at 5 minutes and a birthweight of   ounces.     Delivery Anesthesia: Epidural   DISPOSITION:  Infant to left in stable condition in the delivery room, with L&D personnel and mother,  Risks/Benefits The options for labor management were discussed with the patient and her partner, as were the delivery options including a Kiwi vacuum delivery. The risks and proposed benefits of vacuum delivery were discussed in detail.  The possibility of cesarean delivery was also discussed. The patient decided to proceed with a Kiwi vacuum delivery.    DESCRIPTION OF THE PROCEDURE:    The baby's head was noted to be on the pelvic floor.  The bladder was confirmed to be empty. The vacuum was placed and the correct placement was confirmed digitally. With contractions, vacuum traction was applied to assist with maternal pushes.  There were no pop-offs.  The duration of vacuum use was 23 minutes.  The vacuum was used to assist in delivery of the fetal vertex and then deflated and removed.  The remainder of the delivery of the infant and placenta was effected in the usual manner.   Episiotomy/Lacerations:  None  See Aisha Ali CNM for labor course and remainder of delivery note.

## 2024-04-15 NOTE — Progress Notes (Signed)
 Labor Progress Note   ASSESSMENT/PLAN   Morgan Young 28 y.o.   G2P1001  at [redacted]w[redacted]d here for postdates IOL.  FWB:  - Fetal well being assessed: Category 2        GBS: - GBS negative  LABOR: - Prolonged second stage - Discussed labor progress and concerns with Cristi Donalds, and family. Reviewed possibility of vacuum or cesarean birth. - Consulted Dr. Luster Salters to assess for operative delivery and he is en route to hospital - Pain Management: labor support, position changes - Anticipate VAVD     SUBJECTIVE/OBJECTIVE   SUBJECTIVE:   Morgan Young has been pushing for about 3 hours now. Her pushing efforts have been poor at times and effective at other times. She has pushed in multiple positions. Pitocin has been increased to 8 mU/min but she is having difficulty tolerating contractions. Her epidural was disconnected inadvertently about an hour ago. Caput noted, head slightly asynclitic. Pelvis feels adequate for vaginal birth.  OBJECTIVE: Vital Signs: Patient Vitals for the past 12 hrs:  BP Temp Temp src Pulse Resp SpO2  04/15/24 1845 -- -- -- -- -- 100 %  04/15/24 1843 -- -- -- -- -- 99 %  04/15/24 1835 -- -- -- -- -- 99 %  04/15/24 1830 -- -- -- -- -- 98 %  04/15/24 1825 -- -- -- -- -- 99 %  04/15/24 1821 -- -- -- -- -- 100 %  04/15/24 1820 -- -- -- -- -- 100 %  04/15/24 1815 132/80 -- -- -- -- 98 %  04/15/24 1810 -- 98.2 F (36.8 C) Oral -- -- 98 %  04/15/24 1805 -- -- -- -- -- 97 %  04/15/24 1800 -- -- -- -- -- 98 %  04/15/24 1755 -- -- -- -- -- 99 %  04/15/24 1750 -- -- -- -- -- 100 %  04/15/24 1745 -- -- -- -- -- 98 %  04/15/24 1740 -- -- -- -- -- 99 %  04/15/24 1735 -- -- -- -- -- 99 %  04/15/24 1730 -- -- -- -- -- 100 %  04/15/24 1725 -- -- -- -- -- 99 %  04/15/24 1720 -- -- -- -- -- 99 %  04/15/24 1715 -- -- -- -- -- 99 %  04/15/24 1713 122/77 -- -- 80 -- --  04/15/24 1710 -- -- -- -- -- 100 %  04/15/24 1705 -- -- -- -- -- 99 %  04/15/24 1700 -- -- -- -- -- 99 %   04/15/24 1655 -- -- -- -- -- 100 %  04/15/24 1650 -- -- -- -- -- 99 %  04/15/24 1645 -- -- -- -- -- 100 %  04/15/24 1640 -- -- -- -- -- 100 %  04/15/24 1635 -- -- -- -- -- 97 %  04/15/24 1630 -- -- -- -- -- 100 %  04/15/24 1625 -- -- -- -- -- 100 %  04/15/24 1621 -- -- -- -- -- 99 %  04/15/24 1620 -- -- -- -- -- 99 %  04/15/24 1615 -- 98.1 F (36.7 C) Oral -- -- 100 %  04/15/24 1610 -- -- -- -- -- 100 %  04/15/24 1605 -- -- -- -- -- 100 %  04/15/24 1558 130/69 -- -- 73 -- 100 %  04/15/24 1551 106/87 -- -- 90 -- --  04/15/24 1550 -- -- -- -- -- 100 %  04/15/24 1545 -- -- -- -- -- 100 %  04/15/24 1540 -- -- -- -- -- 96 %  04/15/24 1535 -- -- -- -- -- 99 %  04/15/24 1530 -- -- -- -- -- 99 %  04/15/24 1520 -- -- -- -- -- 97 %  04/15/24 1518 -- 97.8 F (36.6 C) Oral -- -- 99 %  04/15/24 1510 -- -- -- -- -- 99 %  04/15/24 1505 -- -- -- -- -- 99 %  04/15/24 1500 102/60 -- -- 83 -- 99 %  04/15/24 1455 -- -- -- -- -- 99 %  04/15/24 1450 -- -- -- -- -- 99 %  04/15/24 1445 -- -- -- -- -- 99 %  04/15/24 1440 -- -- -- -- -- 100 %  04/15/24 1435 -- -- -- -- -- 100 %  04/15/24 1430 -- -- -- -- -- 99 %  04/15/24 1426 -- -- -- -- -- 100 %  04/15/24 1421 -- -- -- -- -- 100 %  04/15/24 1420 -- -- -- -- -- 100 %  04/15/24 1415 -- -- -- -- -- 99 %  04/15/24 1410 -- -- -- -- -- 99 %  04/15/24 1405 -- -- -- -- -- 99 %  04/15/24 1404 108/60 -- -- 74 -- --  04/15/24 1400 -- -- -- -- -- 100 %  04/15/24 1330 -- -- -- -- -- 100 %  04/15/24 1320 -- -- -- -- -- 100 %  04/15/24 1315 -- -- -- -- -- 99 %  04/15/24 1310 -- -- -- -- -- 100 %  04/15/24 1305 -- -- -- -- -- 100 %  04/15/24 1301 105/67 -- -- 70 -- --  04/15/24 1300 -- -- -- -- -- 100 %  04/15/24 1259 (!) 84/24 -- -- (!) 198 -- --  04/15/24 1255 -- -- -- -- -- 100 %  04/15/24 1250 -- -- -- -- -- 99 %  04/15/24 1245 -- -- -- -- -- 99 %  04/15/24 1243 124/73 -- -- 60 -- --  04/15/24 1240 -- -- -- -- -- 99 %  04/15/24 1235 -- -- -- -- --  100 %  04/15/24 1230 -- -- -- -- -- 100 %  04/15/24 1228 127/76 -- -- 61 -- --  04/15/24 1225 -- -- -- -- -- 99 %  04/15/24 1220 -- -- -- -- -- 99 %  04/15/24 1215 -- -- -- -- -- 100 %  04/15/24 1213 126/73 -- -- 69 -- --  04/15/24 1210 -- -- -- -- -- 99 %  04/15/24 1205 -- -- -- -- -- 97 %  04/15/24 1200 -- -- -- -- -- 99 %  04/15/24 1158 126/65 -- -- 70 -- --  04/15/24 1155 -- -- -- -- -- 100 %  04/15/24 1153 130/73 -- -- 74 -- --  04/15/24 1150 -- -- -- -- -- 100 %  04/15/24 1148 125/74 -- -- 73 -- --  04/15/24 1145 124/72 -- -- 81 -- 100 %  04/15/24 1143 124/65 -- -- 74 -- --  04/15/24 1141 123/62 98.2 F (36.8 C) Oral 73 16 --  04/15/24 1140 -- -- -- -- -- 99 %  04/15/24 1138 (!) 143/81 -- -- (!) 125 -- --  04/15/24 1136 131/68 -- -- 76 -- --  04/15/24 1135 126/80 -- -- 82 -- 99 %  04/15/24 1130 -- -- -- -- -- 98 %  04/15/24 1125 -- -- -- -- -- 97 %  04/15/24 1123 121/74 -- -- 82 -- --  04/15/24 1120 -- -- -- -- -- 97 %    Last  SVE:  Dilation: 10 Dilation Complete Date: 04/15/24 Dilation Complete Time: 1843 Effacement (%): 100 Cervical Position: Middle Station: Plus 1, Plus 2 Presentation: Vertex Exam by:: Cinda Craze, CNM -  , Rupture Date: 04/15/24, Rupture Time: 1557,    FHR:   - Baseline: 135 - Variability:  moderate - Accels: present - Decels: intermittent variables/brief periods of non-continuous tracing Category 2  UTERINE ACTIVITY:  Contractions: q 2-4 minutes  Phylliss Brenner, CNM

## 2024-04-15 NOTE — Anesthesia Preprocedure Evaluation (Signed)
 Anesthesia Evaluation  Patient identified by MRN, date of birth, ID band Patient awake    Reviewed: Allergy & Precautions, H&P , NPO status , Patient's Chart, lab work & pertinent test results  Airway Mallampati: II       Dental no notable dental hx. (+) Teeth Intact   Pulmonary neg pulmonary ROS          Cardiovascular negative cardio ROS      Neuro/Psych   Anxiety Depression    negative neurological ROS     GI/Hepatic Neg liver ROS,GERD  ,,  Endo/Other  negative endocrine ROS    Renal/GU negative Renal ROS  negative genitourinary   Musculoskeletal   Abdominal   Peds  Hematology   Anesthesia Other Findings   Reproductive/Obstetrics (+) Pregnancy                             Anesthesia Physical Anesthesia Plan  ASA: 2  Anesthesia Plan: Epidural   Post-op Pain Management:    Induction:   PONV Risk Score and Plan:   Airway Management Planned: Natural Airway  Additional Equipment:   Intra-op Plan:   Post-operative Plan:   Informed Consent: I have reviewed the patients History and Physical, chart, labs and discussed the procedure including the risks, benefits and alternatives for the proposed anesthesia with the patient or authorized representative who has indicated his/her understanding and acceptance.     Dental Advisory Given  Plan Discussed with: Anesthesiologist and CRNA  Anesthesia Plan Comments: (Patient reports no bleeding problems and no anticoagulant use.   Patient consented for risks of anesthesia including but not limited to:  - adverse reactions to medications - risk of bleeding, infection and or nerve damage from epidural that could lead to paralysis - risk of headache or failed epidural - nerve damage due to positioning - that if epidural is used for C-section that there is a chance of epidural failure requiring spinal placement or conversion to GA -  Damage to heart, brain, lungs, other parts of body or loss of life  Patient voiced understanding and assent.)       Anesthesia Quick Evaluation

## 2024-04-15 NOTE — H&P (Signed)
 [facility] Labor & Delivery  History and Physical  ASSESSMENT AND PLAN   Morgan Young is a 28 y.o. G2P1001 at [redacted]w[redacted]d with EDD: 04/09/2024, by 6 week Ultrasound admitted for induction of labor due to postdates pregnancy.  Induction plan - Cook Catheter placed with patient in frog leg position, cervix located digitally. Uterine and vaginal balloon inflated with 80 mL LR each.  - Will start pitocin titration to augment patient's current contraction pattern.  - Patient desires to not have an epidural. She is aware of other pain management options.  -Anticipate SVD.  Fetal Status: - cephalic presentation by Leopold's - EFW: 7.5 lbs by Leopold's - Continuous FHR monitoring - FHT currently cat II  Other Active Problems:  Depression - Not currently on medication. - Plan for two week PP mood check.   Mild Anemia - Hgb 10.0 on admission.  Labs/Immunizations: Prenatal labs and studies: ABO, Rh: --/--/A POS (05/23 8119) Antibody: NEG (05/23 0627) Rubella: 1.26 (11/01 1108) Varicella: Non Reactive (11/01 1108)  RPR: Non Reactive (02/21 0957)  HBsAg: Negative (11/01 1108)  HepC: Non Reactive (11/01 1108) HIV: Non Reactive (02/21 0957)  GBS: Negative/-- (04/22 1135)   TDAP: given prenatally Flu: no  Postpartum Plan: - Feeding: Breast Milk and Formula - Contraception: plans Postpartum BTL - Prenatal Care Provider: AOB     HPI   Chief Complaint: Post dates pregnancy  Morgan Young is a 28 y.o. G2P1001 at [redacted]w[redacted]d who presents for induction of labor due to postdates pregnancy. She reports irregular contractions and denies LOF and vaginal bleeding.    Pregnancy Complications Patient Active Problem List   Diagnosis Date Noted   Encounter for induction of labor 04/15/2024   Anemia affecting pregnancy 01/18/2024   Maternal varicella, non-immune 10/21/2023   Elevated hemoglobin A1c 09/29/2023   IUGR (intrauterine growth restriction) in prior pregnancy,  pregnant 09/25/2023   Supervision of other normal pregnancy, antepartum 08/20/2023   Depression, major, single episode, mild (HCC) 05/25/2023   Obesity (BMI 30-39.9) 05/21/2022   Seasonal allergies 05/21/2022   Nut allergy 05/21/2022    Review of Systems A twelve point review of systems was negative except as stated in HPI.   HISTORY   Medications Facility-Administered Medications Prior to Admission  Medication Dose Route Frequency Provider Last Rate Last Admin   triamcinolone  cream (KENALOG ) 0.1 % cream   Topical BID        Medications Prior to Admission  Medication Sig Dispense Refill Last Dose/Taking   ferrous sulfate  325 (65 FE) MG tablet Take 1 tablet (325 mg total) by mouth every other day. 60 tablet 4 Past Month   levocetirizine (XYZAL) 5 MG tablet Take 5 mg by mouth daily.   Past Month   montelukast (SINGULAIR) 10 MG tablet Take 10 mg by mouth daily.   Past Month   ondansetron  (ZOFRAN -ODT) 4 MG disintegrating tablet Take 4 mg by mouth every 8 (eight) hours as needed.   Past Week   Prenatal Vit-Fe Fumarate-FA (MULTIVITAMIN-PRENATAL) 27-0.8 MG TABS tablet Take 1 tablet by mouth daily at 12 noon.   Past Month   acetaminophen  (TYLENOL ) 325 MG tablet Take 650 mg by mouth every 6 (six) hours as needed.      EPINEPHrine  (EPIPEN  2-PAK) 0.3 mg/0.3 mL IJ SOAJ injection Inject 0.3 mg into the muscle as needed for anaphylaxis. 2 each 0    promethazine  (PHENERGAN ) 12.5 MG tablet Take 1 tablet (12.5 mg total) by mouth every 6 (six) hours as  needed for nausea or vomiting. 60 tablet 2     Allergies is allergic to peanut-containing drug products, cantaloupe (diagnostic), and watermelon [citrullus vulgaris].   OB History OB History  Gravida Para Term Preterm AB Living  2 1 1  0 0 1  SAB IAB Ectopic Multiple Live Births  0 0 0 0 1    # Outcome Date GA Lbr Len/2nd Weight Sex Type Anes PTL Lv  2 Current           1 Term 04/18/20 [redacted]w[redacted]d  1871 g F Vag-Spont  Y LIV    Past Medical  History Past Medical History:  Diagnosis Date   Allergy    Anxiety    History of self-harm    History of self-harm    cutting-last 2011   History of urinary tract infection    Ovarian cyst 2024   Scoliosis    Screening for diabetes mellitus 09/29/2023    Past Surgical History Past Surgical History:  Procedure Laterality Date   Denies surgical hx     LAPAROSCOPIC OVARIAN CYSTECTOMY Left 08/31/2023   Procedure: LAPAROSCOPIC OVARIAN CYSTECTOMY;  Surgeon: Prescilla Brod, MD;  Location: [facility] ORS;  Service: Gynecology;  Laterality: Left;   WISDOM TOOTH EXTRACTION     four; age 9    Social History  reports that she has never smoked. She has never used smokeless tobacco. She reports that she does not currently use alcohol. She reports that she does not currently use drugs after having used the following drugs: Marijuana.   Family History family history includes Asthma in her mother; Depression in her mother; Diabetes in her sister; Diabetes Mellitus I in her mother; HIV/AIDS in her maternal grandmother; Healthy in her brother and sister; Heart murmur in her daughter; Hyperlipidemia in her maternal grandmother and mother; Hypertension in her maternal grandmother and mother; Liver disease in her brother and mother; Other in her father; Pancreatic cancer in her maternal grandfather.   PHYSICAL EXAM   Vitals:   04/15/24 0637 04/15/24 0729  BP: 126/75 118/70  Pulse: 75 71  Resp: 16 18  Temp: 98.2 F (36.8 C) 98 F (36.7 C)  TempSrc: Axillary Oral  Weight: 106.6 kg   Height: 5\' 6"  (1.676 m)     Constitutional: No acute distress, well appearing, and well nourished. Neurologic: She is alert and conversational.  Psychiatric: She has a normal mood and affect.  Musculoskeletal: Normal gait, grossly normal range of motion Cardiovascular: Normal rate.   Pulmonary/Chest: Normal work of breathing.  Gastrointestinal/Abdominal: Soft. Gravid. There is no tenderness.  Skin: Skin is warm and  dry. No rash noted.  Genitourinary: Normal external female genitalia.  SVE:  Dilation: 3 Effacement (%): 70 Station: -2 Presentation: Vertex Exam by:: S Demaris Leavell CNM SSE: deferred  NST Interpretation Indication: Induction Baseline: 140 bpm Variability: moderate Accelerations: present Decelerations: absent Contractions: irregular Time noted:  See OBIX Impression: reactive Authenticated by: Verita Glassman Delyle Weider   Attending Dr. Alvia Awkward was immediately available for the care of the patient.   Verita Glassman [place] Perra, CNM

## 2024-04-16 DIAGNOSIS — D62 Acute posthemorrhagic anemia: Secondary | ICD-10-CM | POA: Diagnosis not present

## 2024-04-16 LAB — CBC
HCT: 25.1 % — ABNORMAL LOW (ref 36.0–46.0)
Hemoglobin: 8.3 g/dL — ABNORMAL LOW (ref 12.0–15.0)
MCH: 26.6 pg (ref 26.0–34.0)
MCHC: 33.1 g/dL (ref 30.0–36.0)
MCV: 80.4 fL (ref 80.0–100.0)
Platelets: 189 10*3/uL (ref 150–400)
RBC: 3.12 MIL/uL — ABNORMAL LOW (ref 3.87–5.11)
RDW: 14.9 % (ref 11.5–15.5)
WBC: 15.3 10*3/uL — ABNORMAL HIGH (ref 4.0–10.5)
nRBC: 0 % (ref 0.0–0.2)

## 2024-04-16 MED ORDER — OXYTOCIN-SODIUM CHLORIDE 30-0.9 UT/500ML-% IV SOLN
2.5000 [IU]/h | INTRAVENOUS | Status: DC | PRN
  Administered 2024-04-16: 2.5 [IU]/h via INTRAVENOUS
  Filled 2024-04-16: qty 500

## 2024-04-16 MED ORDER — OXYCODONE-ACETAMINOPHEN 5-325 MG PO TABS: 1.0000 | ORAL_TABLET | ORAL | Status: DC | PRN

## 2024-04-16 MED ORDER — METHYLERGONOVINE MALEATE 0.2 MG/ML IJ SOLN: 0.2000 mg | INTRAMUSCULAR | Status: DC

## 2024-04-16 MED ORDER — SODIUM CHLORIDE 0.9 % IV SOLN: INTRAVENOUS | Status: DC | PRN

## 2024-04-16 MED ORDER — METHYLERGONOVINE MALEATE 0.2 MG PO TABS: 0.2000 mg | ORAL_TABLET | ORAL | Status: DC | PRN

## 2024-04-16 MED ORDER — METHYLPREDNISOLONE SODIUM SUCC 125 MG IJ SOLR: 125.0000 mg | Freq: Once | INTRAMUSCULAR | Status: DC | PRN

## 2024-04-16 MED ORDER — SODIUM CHLORIDE 0.9 % IV BOLUS: 500.0000 mL | Freq: Once | INTRAVENOUS | Status: DC | PRN

## 2024-04-16 MED ORDER — DIPHENHYDRAMINE HCL 50 MG/ML IJ SOLN: 25.0000 mg | Freq: Once | INTRAMUSCULAR | Status: DC | PRN

## 2024-04-16 MED ORDER — VARICELLA VIRUS VACCINE LIVE 1350 PFU/0.5ML IJ SUSR
0.5000 mL | Freq: Once | INTRAMUSCULAR | Status: DC
  Filled 2024-04-16: qty 0.5

## 2024-04-16 MED ORDER — IBUPROFEN 600 MG PO TABS
ORAL_TABLET | ORAL | Status: AC
  Filled 2024-04-16: qty 1

## 2024-04-16 MED ORDER — WITCH HAZEL-GLYCERIN EX PADS
MEDICATED_PAD | CUTANEOUS | Status: DC | PRN
  Filled 2024-04-16: qty 100

## 2024-04-16 MED ORDER — LACTATED RINGERS IV SOLN: INTRAVENOUS | Status: DC

## 2024-04-16 MED ORDER — FERROUS SULFATE 325 (65 FE) MG PO TABS
325.0000 mg | ORAL_TABLET | Freq: Every day | ORAL | Status: DC
  Administered 2024-04-16 – 2024-04-17 (×2): 325 mg via ORAL
  Filled 2024-04-16 (×2): qty 1

## 2024-04-16 MED ORDER — BENZOCAINE-MENTHOL 20-0.5 % EX AERO
1.0000 | INHALATION_SPRAY | CUTANEOUS | Status: DC | PRN
  Filled 2024-04-16: qty 56

## 2024-04-16 MED ORDER — METHYLERGONOVINE MALEATE 0.2 MG PO TABS
0.2000 mg | ORAL_TABLET | ORAL | Status: DC
  Administered 2024-04-16 – 2024-04-17 (×9): 0.2 mg via ORAL
  Filled 2024-04-16 (×10): qty 1

## 2024-04-16 MED ORDER — MISOPROSTOL 200 MCG PO TABS
200.0000 ug | ORAL_TABLET | Freq: Once | ORAL | Status: AC
  Administered 2024-04-16: 200 ug via ORAL

## 2024-04-16 MED ORDER — MISOPROSTOL 200 MCG PO TABS
ORAL_TABLET | ORAL | Status: AC
  Filled 2024-04-16: qty 2

## 2024-04-16 MED ORDER — COCONUT OIL OIL: 1.0000 | TOPICAL_OIL | Status: DC | PRN

## 2024-04-16 MED ORDER — METHYLERGONOVINE MALEATE 0.2 MG/ML IJ SOLN: 0.2000 mg | INTRAMUSCULAR | Status: DC | PRN

## 2024-04-16 MED ORDER — PRENATAL MULTIVITAMIN CH
1.0000 | ORAL_TABLET | Freq: Every day | ORAL | Status: DC
  Administered 2024-04-16: 1 via ORAL
  Filled 2024-04-16: qty 1

## 2024-04-16 MED ORDER — SIMETHICONE 80 MG PO CHEW: 80.0000 mg | CHEWABLE_TABLET | ORAL | Status: DC | PRN

## 2024-04-16 MED ORDER — DOCUSATE SODIUM 100 MG PO CAPS
100.0000 mg | ORAL_CAPSULE | Freq: Two times a day (BID) | ORAL | Status: DC
  Administered 2024-04-16 – 2024-04-17 (×4): 100 mg via ORAL
  Filled 2024-04-16 (×4): qty 1

## 2024-04-16 MED ORDER — LACTATED RINGERS IV BOLUS
1000.0000 mL | Freq: Once | INTRAVENOUS | Status: AC
  Administered 2024-04-16: 1000 mL via INTRAVENOUS

## 2024-04-16 MED ORDER — ALBUTEROL SULFATE (2.5 MG/3ML) 0.083% IN NEBU: 2.5000 mg | INHALATION_SOLUTION | Freq: Once | RESPIRATORY_TRACT | Status: DC | PRN

## 2024-04-16 MED ORDER — EPINEPHRINE 0.3 MG/0.3ML IJ SOAJ: 0.3000 mg | Freq: Once | INTRAMUSCULAR | Status: DC | PRN

## 2024-04-16 MED ORDER — IRON SUCROSE 500 MG IVPB - SIMPLE MED
500.0000 mg | Freq: Once | INTRAVENOUS | Status: AC
  Administered 2024-04-16: 500 mg via INTRAVENOUS
  Filled 2024-04-16: qty 500

## 2024-04-16 MED ORDER — CALCIUM CARBONATE ANTACID 500 MG PO CHEW: 2.0000 | CHEWABLE_TABLET | ORAL | Status: DC | PRN

## 2024-04-16 MED ORDER — LACTATED RINGERS IV BOLUS: 1000.0000 mL | Freq: Once | INTRAVENOUS | Status: AC

## 2024-04-16 MED ORDER — IBUPROFEN 600 MG PO TABS
600.0000 mg | ORAL_TABLET | Freq: Four times a day (QID) | ORAL | Status: DC
  Administered 2024-04-16 – 2024-04-17 (×6): 600 mg via ORAL
  Filled 2024-04-16 (×6): qty 1

## 2024-04-16 MED ORDER — ACETAMINOPHEN 325 MG PO TABS: 650.0000 mg | ORAL_TABLET | ORAL | Status: DC | PRN

## 2024-04-16 MED ORDER — DIPHENHYDRAMINE HCL 25 MG PO CAPS: 25.0000 mg | ORAL_CAPSULE | Freq: Four times a day (QID) | ORAL | Status: DC | PRN

## 2024-04-16 NOTE — Significant Event (Signed)
 Called to bedside to assess bleeding after Shareece passed a large clot. In the immediate recovery period, Rayli had some gushes of blood and scheduled PO methergine was ordered along with the standard PP Pitocin. She voided twice while in recovery but had not been up to the bathroom since going out to PP. After she had been on PP for a few hour, she started to stand up, felt something come out of her vagina, and then laid back down. She did not continue bleeding heavily after passing that clot. QBL from that episode was 386 mL. IV Pitocin and a fluid bolus were running upon my arrival. Exam showed that her fundus was firm, 3FB above U, and deviated to the right. She had scant bleeding with fundal massage. She was assisted to the bathroom, voided 400 mL, and passed another moderate clot in the toilet.  She was assisted back to the bed. She began to feel a little lightheaded while walking, but felt better once she was back in bed. BP 120/62, HR 83. Her fundus was firm and 2-3 FB below U. Bleeding was a scant trickle. Bimanual exam with cervical sweep did not produce any clots. Lower uterine segment was firm. Cytotec 400 mcg buccal was administered. Instructed to empty bladder q 1-2 hours with RN assistance for ambulation. Offered Foley catheter, but Daylin prefers to empty her bladder on her own. CBC ordered for 0500 but has not yet been collected. Will continue to closely monitor.  Khristopher Kapaun M Bolton Canupp, CNM

## 2024-04-16 NOTE — Lactation Note (Signed)
 This note was copied from a baby's chart. Lactation Consultation Note  Patient Name: Morgan Young [id number] Date: 04/16/2024 Age:28 hours Reason for consult: Initial assessment;Term   Maternal Data Has patient been taught Hand Expression?: Yes Does the patient have breastfeeding experience prior to this delivery?: Yes How long did the patient breastfeed?: 4-5 mths  Feeding Mother's Current Feeding Choice: Breast Milk and Formula Assisted mom with waking baby to feed, encouraged mom to hand express before offering breast, baby then latched easily to right breast in cradle hold, audible swallows observed, mom states she has been offering both breasts at most feedings   LATCH Score Latch: Grasps breast easily, tongue down, lips flanged, rhythmical sucking.  Audible Swallowing: Spontaneous and intermittent  Type of Nipple: Everted at rest and after stimulation  Comfort (Breast/Nipple): Soft / non-tender  Hold (Positioning): Assistance needed to correctly position infant at breast and maintain latch.  LATCH Score: 9   Lactation Tools Discussed/Used  LC name and no written on white board  Interventions Interventions: Breast feeding basics reviewed;Assisted with latch;Skin to skin;Hand express;Support pillows;Education  Discharge Pump: Personal WIC Program: Yes  Consult Status Consult Status: PRN    Leoma Raja 04/16/2024, 11:44 AM

## 2024-04-16 NOTE — Anesthesia Postprocedure Evaluation (Signed)
 Anesthesia Post Note  Patient: Morgan Young  Procedure(s) Performed: AN AD HOC LABOR EPIDURAL  Patient location during evaluation: Mother Baby Anesthesia Type: Epidural Level of consciousness: awake and alert Pain management: pain level controlled Vital Signs Assessment: post-procedure vital signs reviewed and stable Respiratory status: spontaneous breathing, nonlabored ventilation and respiratory function stable Cardiovascular status: stable Postop Assessment: no headache, no backache, patient able to bend at knees and able to ambulate Anesthetic complications: no   No notable events documented.   Last Vitals:  Vitals:   04/16/24 0451 04/16/24 0534  BP: (!) 104/57 120/68  Pulse: 87 100  Resp: 20   Temp: 36.9 C   SpO2: 98%     Last Pain:  Vitals:   04/16/24 0451  TempSrc: Oral  PainSc:                  Portia Brittle Megin Consalvo

## 2024-04-16 NOTE — Progress Notes (Addendum)
 Subjective:   Morgan Young had a VAVD on 04/15/24. Her labor was complicated by prolonged second stage & postpartum hemorrhage. Continues on po methergine, received cytotec after passing clot at ~530 this morning. Had episode of dizziness with void after passing clot, now resolved & ambulating without problem.  Pt. Is eating, hydrating, and voiding regularly without difficulty. Has yet to have BM. She is breastfeeding. Reports mild/moderate vaginal bleeding, lessened in last several hours. Has had cramping abdomen pain relieved with tylenol /ibuprofen . Plans to use BTL for contraception. Denies anxiety/depression symptoms. Endorses good support from partner and family.   Objective:  Vital signs in last 24 hours: Temp:  [97.8 F (36.6 C)-99.7 F (37.6 C)] 99.5 F (37.5 C) (05/24 0738) Pulse Rate:  [60-198] 87 (05/24 0738) Resp:  [16-21] 19 (05/24 0738) BP: (84-143)/(24-87) 106/63 (05/24 0738) SpO2:  [96 %-100 %] 98 % (05/24 0738)    General: NAD Pulmonary: no increased work of breathing Breasts: soft, non-tender, nipples without breakdown Abdomen: soft, non-tender Fundus: firm, midline, at umbilicus Lochia: light rubra, no clots Perineum: no erythema or foul odor discharge, minimal edema, laceration well approximated  Extremities: 1+ non-pitting edema to calves, no erythema, no tenderness  Results for orders placed or performed during the hospital encounter of 04/15/24 (from the past 72 hours)  CBC     Status: Abnormal   Collection Time: 04/15/24  6:27 AM  Result Value Ref Range   WBC 7.2 4.0 - 10.5 K/uL   RBC 3.73 (L) 3.87 - 5.11 MIL/uL   Hemoglobin 10.0 (L) 12.0 - 15.0 g/dL   HCT 86.5 (L) 78.4 - 69.6 %   MCV 80.2 80.0 - 100.0 fL   MCH 26.8 26.0 - 34.0 pg   MCHC 33.4 30.0 - 36.0 g/dL   RDW 29.5 28.4 - 13.2 %   Platelets 234 150 - 400 K/uL   nRBC 0.0 0.0 - 0.2 %    Comment: Performed at [facility], [address] [place], [place] [address]  Type and screen      Status: None   Collection Time: 04/15/24  6:27 AM  Result Value Ref Range   ABO/RH(D) A POS    Antibody Screen NEG    Sample Expiration      04/18/2024,2359 Performed at [facility], [address] [place], [place] [address]   RPR     Status: None   Collection Time: 04/15/24  6:27 AM  Result Value Ref Range   RPR Ser Ql NON REACTIVE NON REACTIVE    Comment: Performed at [facility], [address], [place], [place] [address]  CBC     Status: Abnormal   Collection Time: 04/16/24  6:00 AM  Result Value Ref Range   WBC 15.3 (H) 4.0 - 10.5 K/uL   RBC 3.12 (L) 3.87 - 5.11 MIL/uL   Hemoglobin 8.3 (L) 12.0 - 15.0 g/dL   HCT 34.7 (L) 42.5 - 95.6 %   MCV 80.4 80.0 - 100.0 fL   MCH 26.6 26.0 - 34.0 pg   MCHC 33.1 30.0 - 36.0 g/dL   RDW 38.7 56.4 - 33.2 %   Platelets 189 150 - 400 K/uL   nRBC 0.0 0.0 - 0.2 %    Comment: Performed at [facility], [address] [place], [place] [address]    Assessment:   28 y.o. G2P1001 1 day s/p VAVD Breastfeeding Anemia secondary to acute blood loss- hemodynamically stable and asymptomatic VSS Pain well controlled  Plan:    IV Fe, repeat CBC in am Blood Type --/--/A POS (05/23 1610) / Rubella 1.26 (11/01 1108) / VaricellaNon-Immune Rhogam not indicated Tdap/varicella/rubella to be offered before discharge if indicated Feeding plan breast, lactation support Encouraged to continue breastfeeding, BF education on latch, position changes, cluster feeding, hunger cues, lactogenesis II, milk supply Education given regarding options for contraception, as well as compatibility with breast feeding if applicable.  Patient plans on tubal ligation for contraception. Continued routine postpartum care  Counseled on normal uterine involution and vaginal bleeding postpartum Anticipate discharge home tomorrow    Forestine Igo, CNM [facility] 04/16/2024, 8:49 AM

## 2024-04-16 NOTE — Discharge Instructions (Signed)
 Call office if you have any of the following:  -Persistent headache or visual changes (possible high blood pressure) -Fever >101.0 F or chills (possible infection) -Breast concerns (engorgement, mastitis) -Excessive vaginal bleeding (soaking through more than one pad in 1 hr x 2 hr) -Incision drainage/redness/increased pain/warmth at site (possible infection)  -Leg pain or redness (possible blood clot) -Depression/anxiety increased symptoms 2 weeks after delivery  Activity & Hygiene: -Do not lift > 10 lbs for 6 weeks.  -No intercourse or tampons for 6 weeks.  -No swimming pools, hot tubs or tub baths- showers only for 6 weeks **No driving for 1-2 weeks or while taking pain medication after c-section  -It is normal to bleed for up to 6 weeks. You should not soak through more than 1 pad in 1 hour x 2 hours.  Breastfeeding: -Continue prenatal vitamin.  -Increase calories and fluids.  -Your milk will come in, in the next couple of days (right now it is colostrum).  -You may have a slight fever when your milk comes in, but it should go away on its own.   -If it does not, and rises above 101 F please call the doctor.  -You will also feel achy and your breasts will be firm. They will also start to leak.  *For breastfeeding concerns, the lactation consultant can be reached at [phone]  Not Breastfeeding: -Avoid breast stimulation and wear supportive bra -ICE helps decrease inflammation and pain -Express milk for comfort by hand, do not empty breast  Postpartum blues: -feelings of happy one minute and sad another minute are normal for the first few weeks. -if it gets worse please let your doctor know. It is very common!!

## 2024-04-17 ENCOUNTER — Encounter: Payer: Self-pay | Admitting: Obstetrics and Gynecology

## 2024-04-17 LAB — CBC
HCT: 23.9 % — ABNORMAL LOW (ref 36.0–46.0)
Hemoglobin: 7.9 g/dL — ABNORMAL LOW (ref 12.0–15.0)
MCH: 26.6 pg (ref 26.0–34.0)
MCHC: 33.1 g/dL (ref 30.0–36.0)
MCV: 80.5 fL (ref 80.0–100.0)
Platelets: 191 10*3/uL (ref 150–400)
RBC: 2.97 MIL/uL — ABNORMAL LOW (ref 3.87–5.11)
RDW: 15.2 % (ref 11.5–15.5)
WBC: 12.2 10*3/uL — ABNORMAL HIGH (ref 4.0–10.5)
nRBC: 0.2 % (ref 0.0–0.2)

## 2024-04-17 MED ORDER — FERROUS SULFATE 325 (65 FE) MG PO TABS: 325.0000 mg | ORAL_TABLET | Freq: Every day | ORAL | 3 refills | Status: DC

## 2024-04-17 MED ORDER — IBUPROFEN 600 MG PO TABS: 600.0000 mg | ORAL_TABLET | Freq: Four times a day (QID) | ORAL | Status: DC

## 2024-04-17 NOTE — Plan of Care (Signed)
 D/C order from [facility], [place].  Reviewed d/c instructions and prescriptions with patient and answered any questions.  Patient d/c home with infant via wheelchair by nursing/auxillary.

## 2024-04-17 NOTE — Lactation Note (Signed)
 This note was copied from a baby's chart. Lactation Consultation Note  Patient Name: Morgan Young [id number] Date: 04/17/2024 Age:28 hours Reason for consult: Follow-up assessment;Term   Maternal Data This is mom's 2nd baby, vaginal/vacuum extractor. Mom with post partum hemorrhage. Mom with history of anemia, depression, and obesity. On follow-up today mom is combination feeding: breast and formula. Mom denies any nipple pain or discomfort. Baby is consistently waking to feed and feeding well per parents. Mom has no additional lactation questions or needs at this time. Has patient been taught Hand Expression?: Yes Does the patient have breastfeeding experience prior to this delivery?: Yes How long did the patient breastfeed?: 4-5 months  Feeding Mother's Current Feeding Choice: Breast Milk and Formula   Interventions Interventions: Education  Discharge Discharge Education: Engorgement and breast care;Warning signs for feeding baby;Outpatient recommendation Pump: Personal  Consult Status Consult Status: Complete  Update provided to care nurse.  Angelica Kemp 04/17/2024, 9:46 AM

## 2024-04-29 ENCOUNTER — Telehealth: Admitting: Licensed Practical Nurse

## 2024-04-29 NOTE — Telephone Encounter (Signed)
 Reached out to pt to reschedule the 2 week pp visit via MyChart that was scheduled on 04/29/2024 at 1:55.  Left message for pt to call back to reschedule.

## 2024-05-02 ENCOUNTER — Encounter: Payer: Self-pay | Admitting: Licensed Practical Nurse

## 2024-05-02 NOTE — Telephone Encounter (Signed)
 Reached out to pt (2x) to reschedule the 2 week pp visit via MyChart that was scheduled on 04/29/2024 at 1:55.  Left message for pt to call back to reschedule.  Will send a MyChart letter to pt.

## 2024-05-26 ENCOUNTER — Ambulatory Visit: Admitting: Obstetrics

## 2024-06-07 ENCOUNTER — Ambulatory Visit: Admitting: Obstetrics and Gynecology

## 2024-06-07 ENCOUNTER — Encounter: Payer: Self-pay | Admitting: Obstetrics and Gynecology

## 2024-06-07 NOTE — Progress Notes (Signed)
 Patient presents today for 6 week postpartum follow-up. Patient had a vaginal delivery with vacuum on 04/15/24.  She states breast feeding is going well. She states she would like BTL for birth control. EPDS score of  0. She states no other questions or concerns at this time.

## 2024-06-07 NOTE — Progress Notes (Signed)
 HPI:      Ms. Morgan Young is a 28 y.o. G2P1001 who LMP was No LMP recorded.  Subjective:   She presents today 6 weeks postpartum from a vacuum-assisted vaginal birth.  She reports she is doing well and has no issues.  She states she is not getting a lot of sleep because she is breast-feeding.  She does state that the father of the baby is helping her. She would like to discuss permanent sterilization.  She says that she has done her research and does not desire any type of birth control other than permanent sterilization.    Hx: The following portions of the patient's history were reviewed and updated as appropriate:             She  has a past medical history of Allergy, Anxiety, History of self-harm, History of self-harm, History of urinary tract infection, Ovarian cyst (2024), Scoliosis, and Screening for diabetes mellitus (09/29/2023). She does not have any pertinent problems on file. She  has a past surgical history that includes Denies surgical hx; Wisdom tooth extraction; and Laparoscopic ovarian cystectomy (Left, 08/31/2023). Her family history includes Asthma in her mother; Depression in her mother; Diabetes in her sister; Diabetes Mellitus I in her mother; HIV/AIDS in her maternal grandmother; Healthy in her brother and sister; Heart murmur in her daughter; Hyperlipidemia in her maternal grandmother and mother; Hypertension in her maternal grandmother and mother; Liver disease in her brother and mother; Other in her father; Pancreatic cancer in her maternal grandfather. She  reports that she has never smoked. She has never used smokeless tobacco. She reports that she does not currently use alcohol. She reports that she does not currently use drugs after having used the following drugs: Marijuana. She has a current medication list which includes the following prescription(s): acetaminophen , epinephrine , ferrous sulfate , ferrous sulfate , ibuprofen , levocetirizine, montelukast, ondansetron ,  multivitamin-prenatal, and promethazine , and the following Facility-Administered Medications: triamcinolone  cream. She is allergic to peanut-containing drug products, cantaloupe (diagnostic), and watermelon [citrullus vulgaris].       Review of Systems:  Review of Systems  Constitutional: Denied constitutional symptoms, night sweats, recent illness, fatigue, fever, insomnia and weight loss.  Eyes: Denied eye symptoms, eye pain, photophobia, vision change and visual disturbance.  Ears/Nose/Throat/Neck: Denied ear, nose, throat or neck symptoms, hearing loss, nasal discharge, sinus congestion and sore throat.  Cardiovascular: Denied cardiovascular symptoms, arrhythmia, chest pain/pressure, edema, exercise intolerance, orthopnea and palpitations.  Respiratory: Denied pulmonary symptoms, asthma, pleuritic pain, productive sputum, cough, dyspnea and wheezing.  Gastrointestinal: Denied, gastro-esophageal reflux, melena, nausea and vomiting.  Genitourinary: Denied genitourinary symptoms including symptomatic vaginal discharge, pelvic relaxation issues, and urinary complaints.  Musculoskeletal: Denied musculoskeletal symptoms, stiffness, swelling, muscle weakness and myalgia.  Dermatologic: Denied dermatology symptoms, rash and scar.  Neurologic: Denied neurology symptoms, dizziness, headache, neck pain and syncope.  Psychiatric: Denied psychiatric symptoms, anxiety and depression.  Endocrine: Denied endocrine symptoms including hot flashes and night sweats.   Meds:   Current Outpatient Medications on File Prior to Visit  Medication Sig Dispense Refill   acetaminophen  (TYLENOL ) 325 MG tablet Take 650 mg by mouth every 6 (six) hours as needed.     EPINEPHrine  (EPIPEN  2-PAK) 0.3 mg/0.3 mL IJ SOAJ injection Inject 0.3 mg into the muscle as needed for anaphylaxis. 2 each 0   ferrous sulfate  325 (65 FE) MG tablet Take 1 tablet (325 mg total) by mouth every other day. 60 tablet 4   ferrous sulfate  325  (65 FE)  MG tablet Take 1 tablet (325 mg total) by mouth daily with breakfast. 30 tablet 3   ibuprofen  (ADVIL ) 600 MG tablet Take 1 tablet (600 mg total) by mouth every 6 (six) hours.     levocetirizine (XYZAL) 5 MG tablet Take 5 mg by mouth daily.     montelukast (SINGULAIR) 10 MG tablet Take 10 mg by mouth daily.     ondansetron  (ZOFRAN -ODT) 4 MG disintegrating tablet Take 4 mg by mouth every 8 (eight) hours as needed.     Prenatal Vit-Fe Fumarate-FA (MULTIVITAMIN-PRENATAL) 27-0.8 MG TABS tablet Take 1 tablet by mouth daily at 12 noon.     promethazine  (PHENERGAN ) 12.5 MG tablet Take 1 tablet (12.5 mg total) by mouth every 6 (six) hours as needed for nausea or vomiting. 60 tablet 2   Current Facility-Administered Medications on File Prior to Visit  Medication Dose Route Frequency Provider Last Rate Last Admin   triamcinolone  cream (KENALOG ) 0.1 % cream   Topical BID           Objective:     There were no vitals filed for this visit. There were no vitals filed for this visit.            She defers examination today.          Assessment:    G2P1001 Patient Active Problem List   Diagnosis Date Noted   Postpartum hemorrhage 04/16/2024   Acute blood loss anemia 04/16/2024   Postpartum care following vaginal delivery 04/16/2024   Encounter for induction of labor 04/15/2024   Anemia affecting pregnancy 01/18/2024   Maternal varicella, non-immune 10/21/2023   Elevated hemoglobin A1c 09/29/2023   IUGR (intrauterine growth restriction) in prior pregnancy, pregnant 09/25/2023   Supervision of other normal pregnancy, antepartum 08/20/2023   Depression, major, single episode, mild (HCC) 05/25/2023   Obesity (BMI 30-39.9) 05/21/2022   Seasonal allergies 05/21/2022   Nut allergy 05/21/2022     1. Postpartum care following vaginal delivery     Patient with excellent postpartum recovery.   Plan:            1.  May resume all normal activities.  2.  We have discussed permanent  sterilization versus IUD.  Patient is most interested in permanent sterilization.  She will return for a preop appointment.  Orders No orders of the defined types were placed in this encounter.   No orders of the defined types were placed in this encounter.     F/U  Return in about 3 weeks (around 06/28/2024).  Alm DOROTHA Sar, M.D. 06/07/2024 9:42 AM

## 2024-07-26 ENCOUNTER — Encounter: Admitting: Obstetrics and Gynecology

## 2024-07-26 ENCOUNTER — Encounter: Admitting: Obstetrics & Gynecology

## 2024-07-26 DIAGNOSIS — Z3009 Encounter for other general counseling and advice on contraception: Secondary | ICD-10-CM

## 2024-07-26 DIAGNOSIS — Z01818 Encounter for other preprocedural examination: Secondary | ICD-10-CM

## 2024-08-01 ENCOUNTER — Ambulatory Visit: Admitting: Obstetrics & Gynecology

## 2024-08-01 VITALS — BP 120/83 | HR 73 | Ht 66.0 in | Wt 214.4 lb

## 2024-08-01 DIAGNOSIS — D649 Anemia, unspecified: Secondary | ICD-10-CM

## 2024-08-01 NOTE — Progress Notes (Signed)
    GYNECOLOGY PROGRESS NOTE  Subjective:    Patient ID: Morgan Young, female    DOB: Jun 16, 1996, 28 y.o.   MRN: [id number]  HPI  Patient is a 23 engaged P2 (4 yo daughter and 69 month old daughter) here today for pre op appt for bilateral salpingectomy for permanent sterility.  She is 100% certain that she never wants another pregnancy. She is also aware that there is an extremely low risk of failure.  Of note, she had a laparoscopy and left ovarian cystectomy/reduction of torsion by Dr. Heather Penton during her pregnancy 08/2023.  The following portions of the patient's history were reviewed and updated as appropriate: allergies, current medications, past family history, past medical history, past social history, past surgical history, and problem list.  Review of Systems Pertinent items are noted in HPI.  She had anemia during pregnancy. This has not been checked since delivery. She is using condoms prn.  Objective:   Blood pressure 120/83, pulse 73, height 5' 6 (1.676 m), weight 214 lb 6.4 oz (97.3 kg), last menstrual period 07/21/2024, currently breastfeeding. Body mass index is 34.61 kg/m.   Assessment:   1. Anemia, unspecified type     2.     Unwanted fertility- plan for laparoscopic bilateral salpingectomy - She is aware of risks and benefits - Because she had lap already with umbilical port, consider LUQ entry.  Plan:   1. Anemia, unspecified type (Primary)  - CBC   2. Schedule surgery Request sent to [facility]

## 2024-08-02 LAB — CBC
Hematocrit: 36.6 % (ref 34.0–46.6)
Hemoglobin: 11.3 g/dL (ref 11.1–15.9)
MCH: 26.3 pg — ABNORMAL LOW (ref 26.6–33.0)
MCHC: 30.9 g/dL — ABNORMAL LOW (ref 31.5–35.7)
MCV: 85 fL (ref 79–97)
Platelets: 271 x10E3/uL (ref 150–450)
RBC: 4.29 x10E6/uL (ref 3.77–5.28)
RDW: 16.2 % — ABNORMAL HIGH (ref 11.7–15.4)
WBC: 3.9 x10E3/uL (ref 3.4–10.8)

## 2024-08-22 ENCOUNTER — Other Ambulatory Visit: Payer: Self-pay

## 2024-08-22 ENCOUNTER — Encounter
Admission: RE | Admit: 2024-08-22 | Discharge: 2024-08-22 | Disposition: A | Discharge status: Home / Self Care | Source: Ambulatory Visit | Attending: Obstetrics & Gynecology | Admitting: Obstetrics & Gynecology

## 2024-08-22 NOTE — Patient Instructions (Signed)
 Your procedure is scheduled on: MONDAY 08/29/24 Report to the Registration Desk on the 1st floor of the [facility]. To find out your arrival time, please call [phone] between 1PM - 3PM on: FRIDAY 08/26/24 If your arrival time is 6:00 am, do not arrive before that time as the Medical Mall entrance doors do not open until 6:00 am.  REMEMBER: Instructions that are not followed completely may result in serious medical risk, up to and including death; or upon the discretion of your surgeon and anesthesiologist your surgery may need to be rescheduled.  Do not eat food after midnight the night before surgery.  No gum chewing or hard candies.  You may however, drink CLEAR liquids up to 2 hours before you are scheduled to arrive for your surgery. Do not drink anything within 2 hours of your scheduled arrival time.  Clear liquids include: - water  - apple juice without pulp - gatorade (not RED colors) - black coffee or tea (Do NOT add milk or creamers to the coffee or tea) Do NOT drink anything that is not on this list.  One week prior to surgery: Stop Anti-inflammatories (NSAIDS) such as Advil , Aleve , Ibuprofen , Motrin , Naproxen , Naprosyn  and Aspirin  based products such as Excedrin, Goody's Powder, BC Powder.  Stop ANY OVER THE COUNTER supplements until after surgery.  You may however, continue to take Tylenol  if needed for pain up until the day of surgery.  Continue taking all of your other prescription medications up until the day of surgery.  ON THE DAY OF SURGERY ONLY TAKE THESE MEDICATIONS WITH SIPS OF WATER:  NONE  Use inhalers on the day of surgery and bring to the hospital.  No Alcohol for 24 hours before or after surgery.  No Smoking including e-cigarettes for 24 hours before surgery.  No chewable tobacco products for at least 6 hours before surgery.  No nicotine  patches on the day of surgery.  Do not use any recreational drugs for at least a week (preferably 2  weeks) before your surgery.  Please be advised that the combination of cocaine and anesthesia may have negative outcomes, up to and including death. If you test positive for cocaine, your surgery will be cancelled.  On the morning of surgery brush your teeth with toothpaste and water, you may rinse your mouth with mouthwash if you wish. Do not swallow any toothpaste or mouthwash.  Use CHG Soap or wipes as directed on instruction sheet.  Do not wear jewelry, make-up, hairpins, clips or nail polish.  For welded (permanent) jewelry: bracelets, anklets, waist bands, etc.  Please have this removed prior to surgery.  If it is not removed, there is a chance that hospital personnel will need to cut it off on the day of surgery.  Do not wear lotions, powders, or perfumes.   Do not shave body hair from the neck down 48 hours before surgery.  Contact lenses, hearing aids and dentures may not be worn into surgery.  Do not bring valuables to the hospital. [facility] is not responsible for any missing/lost belongings or valuables.   Bring your C-PAP to the hospital in case you may have to spend the night.   Notify your doctor if there is any change in your medical condition (cold, fever, infection).  Wear comfortable clothing (specific to your surgery type) to the hospital.  After surgery, you can help prevent lung complications by doing breathing exercises.  Take deep breaths and cough every 1-2 hours. Your doctor  may order a device called an Incentive Spirometer to help you take deep breaths. When coughing or sneezing, hold a pillow firmly against your incision with both hands. This is called "splinting." Doing this helps protect your incision. It also decreases belly discomfort.  If you are being discharged the day of surgery, you will not be allowed to drive home. You will need a responsible individual to drive you home and stay with you for 24 hours after surgery.   If you are taking public  transportation, you will need to have a responsible individual with you.  Please call the Pre-admissions Testing Dept. at [phone] if you have any questions about these instructions.  Surgery Visitation Policy:  Patients having surgery or a procedure may have two visitors.  Children under the age of 41 must have an adult with them who is not the patient.  Merchandiser, retail to address health-related social needs:  [email or web address]                                                                                                            Preparing for Surgery with CHLORHEXIDINE  GLUCONATE (CHG) Soap  Chlorhexidine  Gluconate (CHG) Soap  o An antiseptic cleaner that kills germs and bonds with the skin to continue killing germs even after washing  o Used for showering the night before surgery and morning of surgery  Before surgery, you can play an important role by reducing the number of germs on your skin.  CHG (Chlorhexidine  gluconate) soap is an antiseptic cleanser which kills germs and bonds with the skin to continue killing germs even after washing.  Please do not use if you have an allergy to CHG or antibacterial soaps. If your skin becomes reddened/irritated stop using the CHG.  1. Shower the NIGHT BEFORE SURGERY and the MORNING OF SURGERY with CHG soap.  2. If you choose to wash your hair, wash your hair first as usual with your normal shampoo.  3. After shampooing, rinse your hair and body thoroughly to remove the shampoo.  4. Use CHG as you would any other liquid soap. You can apply CHG directly to the skin and wash gently with a scrungie or a clean washcloth.  5. Apply the CHG soap to your body only from the neck down. Do not use on open wounds or open sores. Avoid contact with your eyes, ears, mouth, and genitals (private parts). Wash face and genitals (private parts) with your normal soap.  6. Wash thoroughly, paying special attention to the  area where your surgery will be performed.  7. Thoroughly rinse your body with warm water.  8. Do not shower/wash with your normal soap after using and rinsing off the CHG soap.  9. Pat yourself dry with a clean towel.  10. Wear clean pajamas to bed the night before surgery.  12. Place clean sheets on your bed the night of your first shower and do not sleep with pets.  13. Shower again with the CHG soap on the day of surgery prior to arriving  at the hospital.  14. Do not apply any deodorants/lotions/powders.  15. Please wear clean clothes to the hospital.

## 2024-08-26 ENCOUNTER — Encounter
Admission: RE | Admit: 2024-08-26 | Discharge: 2024-08-26 | Disposition: A | Discharge status: Home / Self Care | Source: Ambulatory Visit | Attending: Obstetrics & Gynecology | Admitting: Obstetrics & Gynecology

## 2024-08-26 ENCOUNTER — Telehealth: Payer: Self-pay | Admitting: Obstetrics & Gynecology

## 2024-08-26 DIAGNOSIS — Z01812 Encounter for preprocedural laboratory examination: Secondary | ICD-10-CM | POA: Insufficient documentation

## 2024-08-26 DIAGNOSIS — Z01818 Encounter for other preprocedural examination: Secondary | ICD-10-CM

## 2024-08-26 LAB — TYPE AND SCREEN
ABO/RH(D): A POS
Antibody Screen: NEGATIVE

## 2024-08-26 LAB — CBC
HCT: 35.1 % — ABNORMAL LOW (ref 36.0–46.0)
Hemoglobin: 11.6 g/dL — ABNORMAL LOW (ref 12.0–15.0)
MCH: 26.7 pg (ref 26.0–34.0)
MCHC: 33 g/dL (ref 30.0–36.0)
MCV: 80.9 fL (ref 80.0–100.0)
Platelets: 298 K/uL (ref 150–400)
RBC: 4.34 MIL/uL (ref 3.87–5.11)
RDW: 16.3 % — ABNORMAL HIGH (ref 11.5–15.5)
WBC: 3.8 K/uL — ABNORMAL LOW (ref 4.0–10.5)
nRBC: 0 % (ref 0.0–0.2)

## 2024-08-26 NOTE — Telephone Encounter (Signed)
 Patient aware that surgery on 08/29/2024 has to be rescheduled due to provider being unable to operate this day. Patient was unable to accept surgery date offered on 09/19/2024. Patient has agreed to surgery date of 09/12/2024. Patient aware that surgery is tentative to change on this date. Patient also interested in previous surgeon Heather Penton at [facility] performing the surgery. I have provided the patient with contact information for [facility] clinic. Patient aware to update the office if she decides to have surgery with [facility] instead. Patient voiced understanding.

## 2024-08-29 DIAGNOSIS — Z01818 Encounter for other preprocedural examination: Secondary | ICD-10-CM

## 2024-09-02 ENCOUNTER — Telehealth: Payer: Self-pay | Admitting: Obstetrics & Gynecology

## 2024-09-02 NOTE — Telephone Encounter (Signed)
 Patient aware that surgery on 09/12/2024 has been rescheduled with an agreed date of 09/26/2024.

## 2024-09-08 ENCOUNTER — Telehealth: Payer: Self-pay | Admitting: Licensed Clinical Social Worker

## 2024-09-08 NOTE — Telephone Encounter (Signed)
 Patient called requesting services. LCSW and patient discussed virtual services. Instructed patient to come register as a patient and call LCSW to schedule an appointment.

## 2024-09-12 DIAGNOSIS — Z01818 Encounter for other preprocedural examination: Secondary | ICD-10-CM

## 2024-09-21 ENCOUNTER — Encounter
Admission: RE | Admit: 2024-09-21 | Discharge: 2024-09-21 | Disposition: A | Discharge status: Home / Self Care | Source: Ambulatory Visit | Attending: Obstetrics & Gynecology | Admitting: Obstetrics & Gynecology

## 2024-09-21 NOTE — Pre-Procedure Instructions (Signed)
 Follow up PAT call completed with patient, she will come in for labs 09/22/24.

## 2024-09-21 NOTE — Patient Instructions (Signed)
 Your procedure is scheduled on: 09/26/24 - Monday Report to the Registration Desk on the 1st floor of the [facility]. To find out your arrival time, please call [phone] between 1PM - 3PM on: 09/23/24 - Friday If your arrival time is 6:00 am, do not arrive before that time as the Medical Mall entrance doors do not open until 6:00 am.  REMEMBER: Instructions that are not followed completely may result in serious medical risk, up to and including death; or upon the discretion of your surgeon and anesthesiologist your surgery may need to be rescheduled.  Do not eat food after midnight the night before surgery.  No gum chewing or hard candies.  You may however, drink CLEAR liquids up to 2 hours before you are scheduled to arrive for your surgery. Do not drink anything within 2 hours of your scheduled arrival time.  Clear liquids include: - water  - apple juice without pulp - gatorade (not RED colors) - black coffee or tea (Do NOT add milk or creamers to the coffee or tea) Do NOT drink anything that is not on this list.  One week prior to surgery: Stop Anti-inflammatories (NSAIDS) such as Advil , Aleve , Ibuprofen , Motrin , Naproxen , Naprosyn  and Aspirin  based products such as Excedrin, Goody's Powder, BC Powder. You may take Tylenol  if needed for pain up until the day of surgery.  Stop ANY OVER THE COUNTER supplements until after surgery.  ON THE DAY OF SURGERY ONLY TAKE THESE MEDICATIONS WITH SIPS OF WATER:  none   No Alcohol for 24 hours before or after surgery.  No Smoking including e-cigarettes for 24 hours before surgery.  No chewable tobacco products for at least 6 hours before surgery.  No nicotine  patches on the day of surgery.  Do not use any recreational drugs for at least a week (preferably 2 weeks) before your surgery.  Please be advised that the combination of cocaine and anesthesia may have negative outcomes, up to and including death. If you test positive for  cocaine, your surgery will be cancelled.  On the morning of surgery brush your teeth with toothpaste and water, you may rinse your mouth with mouthwash if you wish. Do not swallow any toothpaste or mouthwash.  Use CHG Soap or wipes as directed on instruction sheet.  Do not wear jewelry, make-up, hairpins, clips or nail polish.  For welded (permanent) jewelry: bracelets, anklets, waist bands, etc.  Please have this removed prior to surgery.  If it is not removed, there is a chance that hospital personnel will need to cut it off on the day of surgery.  Do not wear lotions, powders, or perfumes.   Do not shave body hair from the neck down 48 hours before surgery.  Contact lenses, hearing aids and dentures may not be worn into surgery.  Do not bring valuables to the hospital. [facility] is not responsible for any missing/lost belongings or valuables.   Notify your doctor if there is any change in your medical condition (cold, fever, infection).  Wear comfortable clothing (specific to your surgery type) to the hospital.  After surgery, you can help prevent lung complications by doing breathing exercises.  Take deep breaths and cough every 1-2 hours. Your doctor may order a device called an Incentive Spirometer to help you take deep breaths. When coughing or sneezing, hold a pillow firmly against your incision with both hands. This is called "splinting." Doing this helps protect your incision. It also decreases belly discomfort.  If you  are being admitted to the hospital overnight, leave your suitcase in the car. After surgery it may be brought to your room.  In case of increased patient census, it may be necessary for you, the patient, to continue your postoperative care in the Same Day Surgery department.  If you are being discharged the day of surgery, you will not be allowed to drive home. You will need a responsible individual to drive you home and stay with you for 24 hours after  surgery.   If you are taking public transportation, you will need to have a responsible individual with you.  Please call the Pre-admissions Testing Dept. at [phone] if you have any questions about these instructions.  Surgery Visitation Policy:  Patients having surgery or a procedure may have two visitors.  Children under the age of 84 must have an adult with them who is not the patient.  Inpatient Visitation:    Visiting hours are 7 a.m. to 8 p.m. Up to four visitors are allowed at one time in a patient room. The visitors may rotate out with other people during the day.  One visitor age 40 or older may stay with the patient overnight and must be in the room by 8 p.m.   Merchandiser, Retail to address health-related social needs:  [email or web address]                                                                                                            Preparing for Surgery with CHLORHEXIDINE  GLUCONATE (CHG) Soap  Chlorhexidine  Gluconate (CHG) Soap  o An antiseptic cleaner that kills germs and bonds with the skin to continue killing germs even after washing  o Used for showering the night before surgery and morning of surgery  Before surgery, you can play an important role by reducing the number of germs on your skin.  CHG (Chlorhexidine  gluconate) soap is an antiseptic cleanser which kills germs and bonds with the skin to continue killing germs even after washing.  Please do not use if you have an allergy to CHG or antibacterial soaps. If your skin becomes reddened/irritated stop using the CHG.  1. Shower the NIGHT BEFORE SURGERY with CHG soap.  2. If you choose to wash your hair, wash your hair first as usual with your normal shampoo.  3. After shampooing, rinse your hair and body thoroughly to remove the shampoo.  4. Use CHG as you would any other liquid soap. You can apply CHG directly to the skin and wash gently with a clean  washcloth.  5. Apply the CHG soap to your body only from the neck down. Do not use on open wounds or open sores. Avoid contact with your eyes, ears, mouth, and genitals (private parts). Wash face and genitals (private parts) with your normal soap.  6. Wash thoroughly, paying special attention to the area where your surgery will be performed.  7. Thoroughly rinse your body with warm water.  8. Do not shower/wash with your normal soap after using and rinsing off the  CHG soap.  9. Do not use lotions, oils, etc., after showering with CHG.  10. Pat yourself dry with a clean towel.  11. Wear clean pajamas to bed the night before surgery.  12. Place clean sheets on your bed the night of your shower and do not sleep with pets.  13. Do not apply any deodorants/lotions/powders.  14. Please wear clean clothes to the hospital.  15. Remember to brush your teeth with your regular toothpaste.

## 2024-09-22 ENCOUNTER — Encounter
Admission: RE | Admit: 2024-09-22 | Discharge: 2024-09-22 | Disposition: A | Discharge status: Home / Self Care | Source: Ambulatory Visit | Attending: Obstetrics & Gynecology | Admitting: Obstetrics & Gynecology

## 2024-09-22 DIAGNOSIS — D62 Acute posthemorrhagic anemia: Secondary | ICD-10-CM | POA: Diagnosis not present

## 2024-09-22 DIAGNOSIS — Z01812 Encounter for preprocedural laboratory examination: Secondary | ICD-10-CM | POA: Diagnosis present

## 2024-09-22 LAB — CBC
HCT: 36.2 % (ref 36.0–46.0)
Hemoglobin: 11.8 g/dL — ABNORMAL LOW (ref 12.0–15.0)
MCH: 26.5 pg (ref 26.0–34.0)
MCHC: 32.6 g/dL (ref 30.0–36.0)
MCV: 81.2 fL (ref 80.0–100.0)
Platelets: 313 K/uL (ref 150–400)
RBC: 4.46 MIL/uL (ref 3.87–5.11)
RDW: 16.5 % — ABNORMAL HIGH (ref 11.5–15.5)
WBC: 5 K/uL (ref 4.0–10.5)
nRBC: 0 % (ref 0.0–0.2)

## 2024-09-22 LAB — TYPE AND SCREEN
ABO/RH(D): A POS
Antibody Screen: NEGATIVE

## 2024-09-26 ENCOUNTER — Encounter: Payer: Self-pay | Admitting: Obstetrics & Gynecology

## 2024-09-26 ENCOUNTER — Ambulatory Visit: Payer: Self-pay | Admitting: Urgent Care

## 2024-09-26 ENCOUNTER — Encounter
Admission: RE | Disposition: A | Discharge status: Home / Self Care | Payer: Self-pay | Source: Home / Self Care | Attending: Obstetrics & Gynecology

## 2024-09-26 ENCOUNTER — Other Ambulatory Visit: Payer: Self-pay

## 2024-09-26 ENCOUNTER — Ambulatory Visit
Admission: RE | Admit: 2024-09-26 | Discharge: 2024-09-26 | Disposition: A | Discharge status: Home / Self Care | Attending: Obstetrics & Gynecology | Admitting: Obstetrics & Gynecology

## 2024-09-26 DIAGNOSIS — D62 Acute posthemorrhagic anemia: Secondary | ICD-10-CM

## 2024-09-26 DIAGNOSIS — Z3009 Encounter for other general counseling and advice on contraception: Secondary | ICD-10-CM | POA: Diagnosis present

## 2024-09-26 DIAGNOSIS — Z302 Encounter for sterilization: Secondary | ICD-10-CM | POA: Insufficient documentation

## 2024-09-26 DIAGNOSIS — Z01818 Encounter for other preprocedural examination: Secondary | ICD-10-CM

## 2024-09-26 DIAGNOSIS — Z01812 Encounter for preprocedural laboratory examination: Secondary | ICD-10-CM

## 2024-09-26 HISTORY — PX: LAPAROSCOPIC TUBAL LIGATION: SHX1937

## 2024-09-26 LAB — POCT PREGNANCY, URINE: Preg Test, Ur: NEGATIVE

## 2024-09-26 SURGERY — LIGATION, FALLOPIAN TUBE, LAPAROSCOPIC
Anesthesia: General | Site: Abdomen | Laterality: Bilateral

## 2024-09-26 MED ORDER — OXYCODONE HCL 5 MG PO TABS
ORAL_TABLET | ORAL | Status: AC
  Filled 2024-09-26: qty 1

## 2024-09-26 MED ORDER — KETOROLAC TROMETHAMINE 30 MG/ML IJ SOLN
INTRAMUSCULAR | Status: DC | PRN
  Administered 2024-09-26: 30 mg via INTRAVENOUS

## 2024-09-26 MED ORDER — ORAL CARE MOUTH RINSE: 15.0000 mL | Freq: Once | OROMUCOSAL | Status: AC

## 2024-09-26 MED ORDER — GLYCOPYRROLATE 0.2 MG/ML IJ SOLN
INTRAMUSCULAR | Status: AC
  Filled 2024-09-26: qty 1

## 2024-09-26 MED ORDER — ROCURONIUM BROMIDE 100 MG/10ML IV SOLN
INTRAVENOUS | Status: DC | PRN
  Administered 2024-09-26: 50 mg via INTRAVENOUS

## 2024-09-26 MED ORDER — PROPOFOL 1000 MG/100ML IV EMUL
INTRAVENOUS | Status: AC
  Filled 2024-09-26: qty 100

## 2024-09-26 MED ORDER — OXYCODONE HCL 5 MG PO TABS
5.0000 mg | ORAL_TABLET | Freq: Once | ORAL | Status: AC | PRN
  Administered 2024-09-26: 5 mg via ORAL

## 2024-09-26 MED ORDER — FENTANYL CITRATE (PF) 100 MCG/2ML IJ SOLN
INTRAMUSCULAR | Status: AC
  Filled 2024-09-26: qty 2

## 2024-09-26 MED ORDER — OXYCODONE HCL 5 MG PO TABS: 5.0000 mg | ORAL_TABLET | ORAL | 0 refills | Status: DC | PRN

## 2024-09-26 MED ORDER — FENTANYL CITRATE (PF) 100 MCG/2ML IJ SOLN
INTRAMUSCULAR | Status: DC | PRN
  Administered 2024-09-26 (×2): 50 ug via INTRAVENOUS

## 2024-09-26 MED ORDER — CHLORHEXIDINE GLUCONATE 0.12 % MT SOLN
15.0000 mL | Freq: Once | OROMUCOSAL | Status: AC
  Administered 2024-09-26: 15 mL via OROMUCOSAL

## 2024-09-26 MED ORDER — GLYCOPYRROLATE 0.2 MG/ML IJ SOLN
INTRAMUSCULAR | Status: DC | PRN
  Administered 2024-09-26: .2 mg via INTRAVENOUS

## 2024-09-26 MED ORDER — OXYCODONE HCL 5 MG/5ML PO SOLN: 5.0000 mg | Freq: Once | ORAL | Status: AC | PRN

## 2024-09-26 MED ORDER — LIDOCAINE HCL (PF) 2 % IJ SOLN
INTRAMUSCULAR | Status: AC
  Filled 2024-09-26: qty 5

## 2024-09-26 MED ORDER — LACTATED RINGERS IV SOLN
INTRAVENOUS | Status: DC
Start: 2024-09-26 — End: 2024-09-26

## 2024-09-26 MED ORDER — POVIDONE-IODINE 10 % EX SWAB
2.0000 | Freq: Once | CUTANEOUS | Status: AC
  Administered 2024-09-26: 2 via TOPICAL

## 2024-09-26 MED ORDER — ONDANSETRON HCL 4 MG/2ML IJ SOLN: 4.0000 mg | Freq: Once | INTRAMUSCULAR | Status: DC | PRN

## 2024-09-26 MED ORDER — BUPIVACAINE HCL (PF) 0.5 % IJ SOLN
INTRAMUSCULAR | Status: DC | PRN
  Administered 2024-09-26: 30 mL

## 2024-09-26 MED ORDER — SODIUM CHLORIDE 0.9 % IV SOLN: INTRAVENOUS | Status: DC | PRN

## 2024-09-26 MED ORDER — DEXMEDETOMIDINE HCL IN NACL 80 MCG/20ML IV SOLN
INTRAVENOUS | Status: DC | PRN
  Administered 2024-09-26: 20 ug via INTRAVENOUS

## 2024-09-26 MED ORDER — CHLORHEXIDINE GLUCONATE 0.12 % MT SOLN
OROMUCOSAL | Status: AC
  Filled 2024-09-26: qty 15

## 2024-09-26 MED ORDER — IBUPROFEN 600 MG PO TABS
600.0000 mg | ORAL_TABLET | Freq: Four times a day (QID) | ORAL | 1 refills | Status: AC | PRN
Start: 2024-09-26 — End: ?

## 2024-09-26 MED ORDER — KETOROLAC TROMETHAMINE 30 MG/ML IJ SOLN
INTRAMUSCULAR | Status: AC
  Filled 2024-09-26: qty 2

## 2024-09-26 MED ORDER — MIDAZOLAM HCL (PF) 2 MG/2ML IJ SOLN
INTRAMUSCULAR | Status: DC | PRN
  Administered 2024-09-26: 2 mg via INTRAVENOUS

## 2024-09-26 MED ORDER — MIDAZOLAM HCL 2 MG/2ML IJ SOLN
INTRAMUSCULAR | Status: AC
  Filled 2024-09-26: qty 2

## 2024-09-26 MED ORDER — SUGAMMADEX SODIUM 200 MG/2ML IV SOLN
INTRAVENOUS | Status: DC | PRN
  Administered 2024-09-26: 200 mg via INTRAVENOUS

## 2024-09-26 MED ORDER — ROCURONIUM BROMIDE 10 MG/ML (PF) SYRINGE
PREFILLED_SYRINGE | INTRAVENOUS | Status: AC
  Filled 2024-09-26: qty 10

## 2024-09-26 MED ORDER — ONDANSETRON HCL 4 MG/2ML IJ SOLN
INTRAMUSCULAR | Status: DC | PRN
  Administered 2024-09-26: 4 mg via INTRAVENOUS

## 2024-09-26 MED ORDER — LIDOCAINE HCL (CARDIAC) PF 100 MG/5ML IV SOSY
PREFILLED_SYRINGE | INTRAVENOUS | Status: DC | PRN
  Administered 2024-09-26: 100 mg via INTRAVENOUS

## 2024-09-26 MED ORDER — OXYCODONE HCL 5 MG PO TABS: 5.0000 mg | ORAL_TABLET | ORAL | 0 refills | Status: AC | PRN

## 2024-09-26 MED ORDER — FENTANYL CITRATE (PF) 100 MCG/2ML IJ SOLN
25.0000 ug | INTRAMUSCULAR | Status: AC | PRN
  Administered 2024-09-26 (×8): 25 ug via INTRAVENOUS

## 2024-09-26 MED ORDER — PROPOFOL 10 MG/ML IV BOLUS
INTRAVENOUS | Status: DC | PRN
  Administered 2024-09-26: 150 ug/kg/min via INTRAVENOUS
  Administered 2024-09-26: 200 mg via INTRAVENOUS
  Administered 2024-09-26: 175 ug/kg/min via INTRAVENOUS

## 2024-09-26 MED ORDER — SODIUM CHLORIDE 0.9 % IR SOLN
Status: DC | PRN
Start: 2024-09-26 — End: 2024-09-26
  Administered 2024-09-26: 1000 mL

## 2024-09-26 MED ORDER — DEXAMETHASONE SOD PHOSPHATE PF 10 MG/ML IJ SOLN
INTRAMUSCULAR | Status: DC | PRN
  Administered 2024-09-26: 10 mg via INTRAVENOUS

## 2024-09-26 MED ORDER — BUPIVACAINE HCL (PF) 0.5 % IJ SOLN
INTRAMUSCULAR | Status: AC
  Filled 2024-09-26: qty 30

## 2024-09-26 MED ORDER — ONDANSETRON HCL 4 MG/2ML IJ SOLN
INTRAMUSCULAR | Status: AC
  Filled 2024-09-26: qty 2

## 2024-09-26 SURGICAL SUPPLY — 33 items
BLADE SURG SZ11 CARB STEEL (BLADE) ×2 IMPLANT
CATH ROBINSON RED A/P 18FR (CATHETERS) ×1 IMPLANT
CHLORAPREP W/TINT 26 (MISCELLANEOUS) ×1 IMPLANT
CLIP FILSHIE TUBAL LIGA STRL (Clip) ×1 IMPLANT
DEFOGGER SCOPE WARM SEASHARP (MISCELLANEOUS) ×2 IMPLANT
ELECTRODE REM PT RETRN 9FT PED (ELECTROSURGICAL) ×1 IMPLANT
GLOVE BIO SURGEON STRL SZ 6.5 (GLOVE) ×4 IMPLANT
GLOVE BIOGEL PI IND STRL 7.0 (GLOVE) ×4 IMPLANT
GOWN STRL REUS W/ TWL LRG LVL3 (GOWN DISPOSABLE) ×6 IMPLANT
IRRIGATION STRYKERFLOW (MISCELLANEOUS) ×1 IMPLANT
KIT PINK PAD W/HEAD ARM REST (MISCELLANEOUS) ×1 IMPLANT
NDL HYPO 25X1 1.5 SAFETY (NEEDLE) ×1 IMPLANT
NDL INSUFFLATION 14GA 120MM (NEEDLE) ×1 IMPLANT
NEEDLE HYPO 25X1 1.5 SAFETY (NEEDLE) IMPLANT
NEEDLE INSUFFLATION 14GA 120MM (NEEDLE) ×2 IMPLANT
NS IRRIG 500ML POUR BTL (IV SOLUTION) ×2 IMPLANT
PACK GYN LAPAROSCOPIC (MISCELLANEOUS) ×2 IMPLANT
PAD OB MATERNITY 11 LF (PERSONAL CARE ITEMS) ×1 IMPLANT
PAD PREP 24X41 OB/GYN DISP (PERSONAL CARE ITEMS) ×1 IMPLANT
SCRUB CHG 4% DYNA-HEX 4OZ (MISCELLANEOUS) ×2 IMPLANT
SHEARS HARMONIC 36 ACE (MISCELLANEOUS) ×1 IMPLANT
SLEEVE Z-THREAD 5X100MM (TROCAR) ×4 IMPLANT
STRIP CLOSURE SKIN 1/2X4 (GAUZE/BANDAGES/DRESSINGS) ×1 IMPLANT
SURGILUBE 2OZ TUBE FLIPTOP (MISCELLANEOUS) ×1 IMPLANT
SUT VIC AB 4-0 PS2 27 (SUTURE) ×3 IMPLANT
SUT VICRYL 0 UR6 27IN ABS (SUTURE) ×1 IMPLANT
SYR 30ML LL (SYRINGE) ×1 IMPLANT
SYSTEM BAG RETRIEVAL 10MM (BASKET) ×1 IMPLANT
TOWEL OR 17X26 4PK STRL BLUE (TOWEL DISPOSABLE) ×4 IMPLANT
TROCAR Z THREAD OPTICAL 5X150 (TROCAR) ×1 IMPLANT
TROCAR Z-THRD FIOS HNDL 11X100 (TROCAR) ×2 IMPLANT
TROCAR Z-THREAD FIOS 5X100MM (TROCAR) ×2 IMPLANT
TUBING EVAC SMOKE HEATED PNEUM (TUBING) ×2 IMPLANT

## 2024-09-26 NOTE — H&P (Signed)
 Please see other note

## 2024-09-26 NOTE — H&P (Signed)
 Morgan Young is an 28 y.o.engaged P2 here for laparoscopy due to unwanted fertility. She is 100% certain that she does not want another pregnancy in her lifetime. Initially we had planned to remove both tubes but her fiance prefers her to have Filsche clips. We discussed that this will not decrease her risk of ovarian cancer in the future and it also has an increased failure rate (1 in 400) but in order to decrease strife at home, she is opting for application of Filsche clips.   Patient's last menstrual period was 09/16/2024.    Past Medical History:  Diagnosis Date   Allergy    Anxiety    History of self-harm    History of self-harm    cutting-last 2011   History of urinary tract infection    Ovarian cyst 2024   Scoliosis    Screening for diabetes mellitus 09/29/2023    Past Surgical History:  Procedure Laterality Date   Denies surgical hx     LAPAROSCOPIC OVARIAN CYSTECTOMY Left 08/31/2023   Procedure: LAPAROSCOPIC OVARIAN CYSTECTOMY;  Surgeon: Verdon Keen, MD;  Location: [facility] ORS;  Service: Gynecology;  Laterality: Left;   WISDOM TOOTH EXTRACTION     four; age 59    Family History  Problem Relation Age of Onset   Asthma Mother    Hypertension Mother    Diabetes Mellitus I Mother        Chaned to type 1 2 weeks ago   Hyperlipidemia Mother    Liver disease Mother    Depression Mother    Other Father        unknown medical history   Diabetes Sister    Healthy Sister    Liver disease Brother    Healthy Brother    Hypertension Maternal Grandmother    Hyperlipidemia Maternal Grandmother    HIV/AIDS Maternal Grandmother    Pancreatic cancer Maternal Grandfather    Heart murmur Daughter     Social History:  reports that she has never smoked. She has never used smokeless tobacco. She reports that she does not currently use alcohol. She reports that she does not currently use drugs after having used the following drugs: Marijuana.  Allergies:  Allergies  Allergen  Reactions   Peanut-Containing Drug Products Hives   Cantaloupe (Diagnostic) Swelling    Rash on face, swelling in eyes   Watermelon [Citrullus Vulgaris] Hives    Facility-Administered Medications Prior to Admission  Medication Dose Route Frequency Provider Last Rate Last Admin   triamcinolone  cream (KENALOG ) 0.1 % cream   Topical BID        Medications Prior to Admission  Medication Sig Dispense Refill Last Dose/Taking   acetaminophen  (TYLENOL ) 325 MG tablet Take 650 mg by mouth every 6 (six) hours as needed.   Past Month   EPINEPHrine  (EPIPEN  2-PAK) 0.3 mg/0.3 mL IJ SOAJ injection Inject 0.3 mg into the muscle as needed for anaphylaxis. 2 each 0 Taking As Needed   levocetirizine (XYZAL) 5 MG tablet Take 5 mg by mouth 4 (four) times a week.   02/23/2024   montelukast (SINGULAIR) 10 MG tablet Take 10 mg by mouth 4 (four) times a week.   02/23/2024   Prenatal Vit-Fe Fumarate-FA (MULTIVITAMIN-PRENATAL) 27-0.8 MG TABS tablet Take 1 tablet by mouth daily at 12 noon.   09/12/2024    Review of Systems  Blood pressure 128/87, pulse 63, temperature (!) 97.5 F (36.4 C), temperature source Temporal, resp. rate 16, height 5' 6 (1.676 m), weight 93  kg, last menstrual period 09/16/2024, SpO2 99%. Physical Exam General:  alert   Breasts:  inspection negative, no nipple discharge or bleeding, no masses or nodularity palpable  Lungs: clear to auscultation bilaterally  Heart:  regular rate and rhythm, S1, S2 normal, no murmur, click, rub or gallop  Abdomen: soft, non-tender; bowel sounds normal; no masses,  no organomegaly    Results for orders placed or performed during the hospital encounter of 09/26/24 (from the past 24 hours)  Pregnancy, urine POC     Status: None   Collection Time: 09/26/24  9:33 AM  Result Value Ref Range   Preg Test, Ur NEGATIVE NEGATIVE      Assessment/Plan: Unwanted fertility Plan for laparoscopic application of Filsche clips.   She understands the risks of surgery,  including, but not to infection, bleeding, DVTs, damage to bowel, bladder, ureters. She wishes to proceed.    Morgan Young 09/26/2024, 10:29 AM

## 2024-09-26 NOTE — Transfer of Care (Signed)
 Immediate Anesthesia Transfer of Care Note  Patient: Morgan Young  Procedure(s) Performed: LIGATION, FALLOPIAN TUBE, LAPAROSCOPIC WITH FILSHIE CLIPS (Bilateral: Abdomen)  Patient Location: PACU  Anesthesia Type:General  Level of Consciousness: sedated  Airway & Oxygen Therapy: Patient Spontanous Breathing and Patient connected to face mask oxygen  Post-op Assessment: Report given to RN  Post vital signs: Reviewed and stable  Last Vitals:  Vitals Value Taken Time  BP 124/73 09/26/24 12:00  Temp    Pulse 100 09/26/24 12:03  Resp 25 09/26/24 12:03  SpO2 95 % 09/26/24 12:03  Vitals shown include unfiled device data.  Last Pain:  Vitals:   09/26/24 0929  TempSrc: Temporal  PainSc: 0-No pain         Complications: There were no known notable events for this encounter.

## 2024-09-26 NOTE — Op Note (Signed)
 09/26/2024  12:08 PM  PATIENT:  Morgan Young  28 y.o. female  PRE-OPERATIVE DIAGNOSIS:  unwanted fertility  POST-OPERATIVE DIAGNOSIS:  unwanted fertility  PROCEDURE:  Procedure(s): LIGATION, FALLOPIAN TUBE, LAPAROSCOPIC WITH FILSHIE CLIPS (Bilateral)  SURGEON:  Surgeons and Role:    * Talah Cookston, Harland BROCKS, MD - Primary    * Leigh Sober, MD - Assisting   EBL:  30 mL   BLOOD ADMINISTERED:none  DRAINS: none   LOCAL MEDICATIONS USED:  MARCAINE      SPECIMEN:  No Specimen  DISPOSITION OF SPECIMEN:  N/A  COUNTS:  YES  TOURNIQUET:  * No tourniquets in log *  DICTATION: .Note written in EPIC  PLAN OF CARE: Discharge to home after PACU  PATIENT DISPOSITION:  PACU - hemodynamically stable.   Delay start of Pharmacological VTE agent (>24hrs) due to surgical blood loss or risk of bleeding: not applicable  The risks, benefits, and alternatives of surgery were explained, accepted, and understood. Consents were signed. She was taken to the operating room and placed in the dorsal lithotomy position. When she was comfortable, general anesthesia was applied without complication. Her abdomen and vagina were prepped and draped in the usual sterile fashion. A bimanual exam revealed a uterus that is normal size and shape, anteverted uterus, normal adnexal exam A Hulka manipulator was placed on her cervix. Gown and gloves were changed.  0.5% Marcaine  was used to inject at all the incision sites prior to making an incision. A 5 mm incision was made in the left upper. A Veress needle was placed intraperitoneally.  I then placed the Optiview trocar (long one was required due to her obesity). Low-flow CO2 was used to insufflate the abdomen to approximately 3 L. Patient pressure was always less than 15 mm Hg. After an excellent pneumoperitoneum was established a 5 mm trocar was placed. Laparoscopy confirmed correct placement. Under direct laparoscopic visualization I placed a 10 mm port in her umbilicus after  injecting marcaine  and making an incision.  She was placed in the Trendelenburg position. The pelvis was inspected. Both ovaries appeared normal as did the liver and gallbladder. I placed a Filsche clip across each entire tube in the isthmic region. Hemostasis was maintained. The CO2 was allowed to escape from the abdomen and the ports were removed. The umbilical fascia was closed with 0 vicryl suture. A subcuticular closure was done with 4-0 Vicryl at the both skin incisions. Steri-Strips were placed across the incisions. She was extubated and taken to the recovery room in stable condition.

## 2024-09-26 NOTE — Anesthesia Procedure Notes (Signed)
 Procedure Name: Intubation Date/Time: 09/26/2024 11:05 AM  Performed by: Bonnetta Jimmey SAUNDERS, CRNAPre-anesthesia Checklist: Patient identified, Emergency Drugs available, Suction available and Patient being monitored Patient Re-evaluated:Patient Re-evaluated prior to induction Oxygen Delivery Method: Circle system utilized Preoxygenation: Pre-oxygenation with 100% oxygen Induction Type: IV induction Ventilation: Mask ventilation without difficulty Laryngoscope Size: McGrath and 3 Grade View: Grade I Tube type: Oral Tube size: 6.5 mm Number of attempts: 1 Airway Equipment and Method: Stylet and Oral airway Placement Confirmation: ETT inserted through vocal cords under direct vision, positive ETCO2 and breath sounds checked- equal and bilateral Secured at: 21 cm Tube secured with: Tape Dental Injury: Teeth and Oropharynx as per pre-operative assessment

## 2024-09-26 NOTE — Anesthesia Preprocedure Evaluation (Signed)
 Anesthesia Evaluation  Patient identified by MRN, date of birth, ID band Patient awake    Reviewed: Allergy & Precautions, NPO status , Patient's Chart, lab work & pertinent test results  Airway Mallampati: II  TM Distance: >3 FB Neck ROM: full    Dental  (+) Teeth Intact   Pulmonary neg pulmonary ROS   Pulmonary exam normal        Cardiovascular Exercise Tolerance: Good negative cardio ROS Normal cardiovascular exam Rhythm:Regular Rate:Normal     Neuro/Psych    Depression    negative neurological ROS  negative psych ROS   GI/Hepatic negative GI ROS, Neg liver ROS,,,  Endo/Other  negative endocrine ROS    Renal/GU negative Renal ROS  negative genitourinary   Musculoskeletal   Abdominal Normal abdominal exam  (+)   Peds negative pediatric ROS (+)  Hematology negative hematology ROS (+)   Anesthesia Other Findings Past Medical History: No date: Allergy No date: Anxiety No date: History of self-harm No date: History of self-harm     Comment:  cutting-last 2011 No date: History of urinary tract infection 2024: Ovarian cyst No date: Scoliosis 09/29/2023: Screening for diabetes mellitus  Past Surgical History: No date: Denies surgical hx 08/31/2023: LAPAROSCOPIC OVARIAN CYSTECTOMY; Left     Comment:  Procedure: LAPAROSCOPIC OVARIAN CYSTECTOMY;  Surgeon:               Verdon Keen, MD;  Location: [facility] ORS;  Service:               Gynecology;  Laterality: Left; No date: WISDOM TOOTH EXTRACTION     Comment:  four; age 19  BMI    Body Mass Index: 33.09 kg/m      Reproductive/Obstetrics negative OB ROS                              Anesthesia Physical Anesthesia Plan  ASA: 2  Anesthesia Plan: General   Post-op Pain Management:    Induction: Intravenous  PONV Risk Score and Plan: 1 and Ondansetron , Dexamethasone , Midazolam and Treatment may vary due to age or medical  condition  Airway Management Planned: Oral ETT  Additional Equipment:   Intra-op Plan:   Post-operative Plan: Extubation in OR  Informed Consent: I have reviewed the patients History and Physical, chart, labs and discussed the procedure including the risks, benefits and alternatives for the proposed anesthesia with the patient or authorized representative who has indicated his/her understanding and acceptance.     Dental Advisory Given  Plan Discussed with: CRNA  Anesthesia Plan Comments:         Anesthesia Quick Evaluation

## 2024-09-26 NOTE — Anesthesia Postprocedure Evaluation (Signed)
 Anesthesia Post Note  Patient: Morgan Young  Procedure(s) Performed: LIGATION, FALLOPIAN TUBE, LAPAROSCOPIC WITH FILSHIE CLIPS (Bilateral: Abdomen)  Patient location during evaluation: PACU Anesthesia Type: General Level of consciousness: awake Vital Signs Assessment: post-procedure vital signs reviewed and stable Respiratory status: spontaneous breathing Cardiovascular status: stable Anesthetic complications: no   There were no known notable events for this encounter.   Last Vitals:  Vitals:   09/26/24 1330 09/26/24 1345  BP: 101/81 131/77  Pulse: 83 80  Resp: 16 16  Temp: 36.9 C   SpO2: 96% 95%    Last Pain:  Vitals:   09/26/24 1330  TempSrc: Temporal  PainSc:                  VAN STAVEREN,Rainna Nearhood

## 2024-09-27 ENCOUNTER — Encounter: Payer: Self-pay | Admitting: Obstetrics & Gynecology

## 2024-09-28 ENCOUNTER — Telehealth: Payer: Self-pay | Admitting: Obstetrics & Gynecology

## 2024-09-28 NOTE — Telephone Encounter (Signed)
 I called to check on her. I left her a  voicemail to call me if she's having any problems.

## 2024-10-01 DIAGNOSIS — Z302 Encounter for sterilization: Secondary | ICD-10-CM

## 2024-10-10 NOTE — Op Note (Signed)
  Starla Harland BROCKS, MD Physician Gynecology   Op Note    Signed   Date of Service: 09/26/2024 12:08 PM   Signed      09/26/2024   12:08 PM   PATIENT:  Morgan Young  28 y.o. female   PRE-OPERATIVE DIAGNOSIS:  unwanted fertility   POST-OPERATIVE DIAGNOSIS:  unwanted fertility   PROCEDURE:  Procedure(s): LIGATION, FALLOPIAN TUBE, LAPAROSCOPIC WITH FILSHIE CLIPS (Bilateral)   SURGEON:  Surgeons and Role:    * Torria Fromer, Harland BROCKS, MD - Primary    * Leigh Sober, MD - Assisting     EBL:  30 mL    BLOOD ADMINISTERED:none   DRAINS: none    LOCAL MEDICATIONS USED:  MARCAINE       SPECIMEN:  No Specimen   DISPOSITION OF SPECIMEN:  N/A   COUNTS:  YES   TOURNIQUET:  * No tourniquets in log *   DICTATION: .Note written in EPIC   PLAN OF CARE: Discharge to home after PACU   PATIENT DISPOSITION:  PACU - hemodynamically stable.   Delay start of Pharmacological VTE agent (>24hrs) due to surgical blood loss or risk of bleeding: not applicable   The risks, benefits, and alternatives of surgery were explained, accepted, and understood. Consents were signed. She was taken to the operating room and placed in the dorsal lithotomy position. When she was comfortable, general anesthesia was applied without complication. Her abdomen and vagina were prepped and draped in the usual sterile fashion. A bimanual exam revealed a uterus that is normal size and shape, anteverted uterus, normal adnexal exam A Hulka manipulator was placed on her cervix. Gown and gloves were changed.  0.5% Marcaine  was used to inject at all the incision sites prior to making an incision. A 5 mm incision was made in the left upper. A Veress needle was placed intraperitoneally.  I then placed the Optiview trocar (long one was required due to her obesity). Low-flow CO2 was used to insufflate the abdomen to approximately 3 L. Patient pressure was always less than 15 mm Hg. After an excellent pneumoperitoneum was established a 5 mm  trocar was placed. Laparoscopy confirmed correct placement. Under direct laparoscopic visualization I placed a 10 mm port in her umbilicus after injecting marcaine  and making an incision.  She was placed in the Trendelenburg position. The pelvis was inspected. Both ovaries appeared normal as did the liver and gallbladder. I placed a Filsche clip across each entire tube in the isthmic region. Hemostasis was maintained. The CO2 was allowed to escape from the abdomen and the ports were removed. The umbilical fascia was closed with 0 vicryl suture. A subcuticular closure was done with 4-0 Vicryl at the both skin incisions. Steri-Strips were placed across the incisions. She was extubated and taken to the recovery room in stable condition.  An experienced assistant was required to retract, provide direct visualization, operating the camera.          Electronically signed by Starla Harland BROCKS, MD at 09/26/2024 12:14 PM

## 2024-10-31 ENCOUNTER — Ambulatory Visit: Admitting: Obstetrics & Gynecology

## 2024-10-31 VITALS — BP 107/77 | HR 94 | Ht 66.0 in | Wt 217.9 lb

## 2024-10-31 DIAGNOSIS — Z9889 Other specified postprocedural states: Secondary | ICD-10-CM

## 2024-10-31 DIAGNOSIS — L089 Local infection of the skin and subcutaneous tissue, unspecified: Secondary | ICD-10-CM

## 2024-10-31 DIAGNOSIS — L929 Granulomatous disorder of the skin and subcutaneous tissue, unspecified: Secondary | ICD-10-CM

## 2024-10-31 MED ORDER — DICLOXACILLIN SODIUM 500 MG PO CAPS: 500.0000 mg | ORAL_CAPSULE | Freq: Four times a day (QID) | ORAL | 0 refills | Status: AC

## 2024-10-31 NOTE — Progress Notes (Signed)
    GYNECOLOGY PROGRESS NOTE  Subjective:    Patient ID: Morgan Young, female    DOB: 10/21/1996, 28 y.o.   MRN: [id number]  HPI  Patient is a 28 y.o. G2P1001 here for a post op visit. Her visit was originally scheduled for tomorrow but she called today with a concern about her incision, so she was seen today. She reports that for 3 days her umbilical incision has been having pus. Otherwise she has no complaints. She reports normal bowel and bladder function. She has had a period since her BTL (Filsche clips) on 09/26/2024.  The following portions of the patient's history were reviewed and updated as appropriate: allergies, current medications, past family history, past medical history, past social history, past surgical history, and problem list.  Review of Systems Pertinent items are noted in HPI.  Pap was normal 09/2023.  Objective:   Blood pressure 107/77, pulse 94, height 5' 6 (1.676 m), weight 217 lb 14.4 oz (98.8 kg). Body mass index is 35.17 kg/m. Well nourished, well hydrated Black female, no apparent distress She is ambulating and conversing normally. Abd- benign Her umbilical incision had some granulation tissue. I probed it with silver nitrate and did note a small bit of pus. I coated it thoroughly with silver nitrate. She tolerated this well.  Assessment:   1. Post-operative state   2. Granulation tissue   3. Wound infection      Plan:   1. Post-operative state (Primary)   2. Granulation tissue - treated with silver nitrate  3. Wound infection - dicloxacillin  prescribed

## 2024-11-01 ENCOUNTER — Encounter: Admitting: Obstetrics & Gynecology

## 2024-12-15 ENCOUNTER — Encounter: Admitting: Physician Assistant

## 2024-12-26 ENCOUNTER — Encounter: Admitting: Physician Assistant

## 2025-01-04 ENCOUNTER — Encounter: Admitting: Family Medicine
